# Patient Record
Sex: Female | Born: 1975 | Race: Black or African American | Hispanic: No | Marital: Married | Smoking: Current every day smoker
Health system: Southern US, Community
[De-identification: ages and names within clinical notes are randomized; demographics above are authoritative.]

## PROBLEM LIST (undated history)

## (undated) DIAGNOSIS — R5383 Other fatigue: Secondary | ICD-10-CM

## (undated) DIAGNOSIS — D509 Iron deficiency anemia, unspecified: Secondary | ICD-10-CM

## (undated) DIAGNOSIS — D4959 Neoplasm of unspecified behavior of other genitourinary organ: Secondary | ICD-10-CM

## (undated) DIAGNOSIS — F329 Major depressive disorder, single episode, unspecified: Secondary | ICD-10-CM

## (undated) DIAGNOSIS — M1990 Unspecified osteoarthritis, unspecified site: Secondary | ICD-10-CM

## (undated) DIAGNOSIS — E785 Hyperlipidemia, unspecified: Secondary | ICD-10-CM

## (undated) DIAGNOSIS — I639 Cerebral infarction, unspecified: Secondary | ICD-10-CM

## (undated) DIAGNOSIS — K219 Gastro-esophageal reflux disease without esophagitis: Secondary | ICD-10-CM

## (undated) DIAGNOSIS — F419 Anxiety disorder, unspecified: Secondary | ICD-10-CM

## (undated) DIAGNOSIS — J302 Other seasonal allergic rhinitis: Secondary | ICD-10-CM

## (undated) DIAGNOSIS — I10 Essential (primary) hypertension: Secondary | ICD-10-CM

## (undated) DIAGNOSIS — F32A Depression, unspecified: Secondary | ICD-10-CM

## (undated) DIAGNOSIS — R918 Other nonspecific abnormal finding of lung field: Secondary | ICD-10-CM

## (undated) DIAGNOSIS — N920 Excessive and frequent menstruation with regular cycle: Secondary | ICD-10-CM

## (undated) DIAGNOSIS — F319 Bipolar disorder, unspecified: Secondary | ICD-10-CM

## (undated) HISTORY — DX: Hyperlipidemia, unspecified: E78.5

## (undated) HISTORY — DX: Iron deficiency anemia, unspecified: D50.9

## (undated) HISTORY — DX: Other fatigue: R53.83

## (undated) HISTORY — PX: OTHER SURGICAL HISTORY: SHX169

## (undated) HISTORY — DX: Excessive and frequent menstruation with regular cycle: N92.0

## (undated) HISTORY — DX: Essential (primary) hypertension: I10

## (undated) HISTORY — DX: Anxiety disorder, unspecified: F41.9

## (undated) HISTORY — DX: Depression, unspecified: F32.A

## (undated) HISTORY — DX: Bipolar disorder, unspecified: F31.9

## (undated) HISTORY — PX: TUBAL LIGATION: SHX77

## (undated) HISTORY — DX: Major depressive disorder, single episode, unspecified: F32.9

## (undated) HISTORY — DX: Other nonspecific abnormal finding of lung field: R91.8

---

## 2000-01-02 DIAGNOSIS — T819XXA Unspecified complication of procedure, initial encounter: Secondary | ICD-10-CM

## 2000-01-02 HISTORY — DX: Unspecified complication of procedure, initial encounter: T81.9XXA

## 2005-06-13 ENCOUNTER — Emergency Department: Payer: Self-pay | Admitting: Emergency Medicine

## 2005-08-02 ENCOUNTER — Emergency Department: Payer: Self-pay | Admitting: Emergency Medicine

## 2006-04-23 ENCOUNTER — Inpatient Hospital Stay: Payer: Self-pay

## 2006-09-23 ENCOUNTER — Emergency Department: Payer: Self-pay | Admitting: Emergency Medicine

## 2007-01-03 ENCOUNTER — Ambulatory Visit (HOSPITAL_COMMUNITY): Admission: RE | Admit: 2007-01-03 | Discharge: 2007-01-03 | Payer: Self-pay | Admitting: Surgery

## 2007-01-08 ENCOUNTER — Ambulatory Visit (HOSPITAL_COMMUNITY): Admission: RE | Admit: 2007-01-08 | Discharge: 2007-01-08 | Payer: Self-pay | Admitting: Surgery

## 2007-01-13 ENCOUNTER — Encounter: Admission: RE | Admit: 2007-01-13 | Discharge: 2007-01-13 | Payer: Self-pay | Admitting: Surgery

## 2007-03-27 ENCOUNTER — Emergency Department: Payer: Self-pay | Admitting: Emergency Medicine

## 2007-09-12 ENCOUNTER — Encounter: Admission: RE | Admit: 2007-09-12 | Discharge: 2007-11-11 | Payer: Self-pay | Admitting: Surgery

## 2007-09-29 ENCOUNTER — Ambulatory Visit (HOSPITAL_COMMUNITY): Admission: RE | Admit: 2007-09-29 | Discharge: 2007-09-30 | Payer: Self-pay | Admitting: Surgery

## 2010-05-16 NOTE — Op Note (Signed)
NAMEMarland Kitchen  Virginia Webb, Virginia Webb NO.:  [id number]   MEDICAL RECORD NO.:  [id number]          PATIENT TYPE:  AMB   LOCATION:  DAY                          FACILITY:  [facility]   PHYSICIAN:  Thornton Park. Daphine Deutscher, MD  DATE OF BIRTH:  February 25, 1975   DATE OF PROCEDURE:  09/29/2007  DATE OF DISCHARGE:                               OPERATIVE REPORT   PREOPERATIVE DIAGNOSIS:  Morbid obesity, body mass index of 56.   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   PROCEDURE:  Laparoscopic adjustable gastric banding with Allergan APL  system.   SURGEON:  Thornton Park. Daphine Deutscher, MD   ASSISTANT:  Alfonse Ras, MD   ANESTHESIA:  General endotracheal.   DESCRIPTION OF PROCEDURE:  This 35 year old African American lady was  taken to room 1 on Monday, September 29, 2007, given general anesthesia.  The abdomen was prepped with Techni-Care and draped sterilely.  Access  to the abdomen was gained through the left upper quadrant using a 0  degree OptiVu without difficulty.  Once insufflation was obtained,  standard trocar placements were made.  Most obvious upon entering was  the fact that she had severe Curtis-Fitz-Hugh bands holding the left  lobe of the liver and the left lateral segment and right lobe all stuck  up to the anterior diaphragm.  I subsequently was able to get in the  [facility] retractor and used the standard port placements including a 15  in the right upper position.  I dissected the spot on the left side for  exit of the band passer and went along, identified the right crus and  using a standard pars flaccida technique, inserted the band passer and  was able to bring an APL band around and engage it in the buckle.  The  sizing catheter was passed from above without difficulty.  The band was  snapped around that and there was there was plenty of room.  He was then  held down while I plicated with three sutures using tie knots and  Surgidac.  An anti-obstruction stitch was placed along the lesser  curvature side and I did get a little hematoma in the stomach on the  left side of that.  Tie knots were used with placement of all the  sutures.  The tubing was then brought out through the lower port on the  right, connected to a port and was sutured to the fascia with four  sutures of 2-0 Prolene.  Wounds were then closed in layers of 4-0 Vicryl  with Benzoin and Steri-Strips.  The patient tolerated the procedure  well, was taken to recovery room in satisfactory addition.      Thornton Park Daphine Deutscher, MD  Electronically Signed     MBM/MEDQ  D:  09/29/2007  T:  09/30/2007  Job:  [id number]   cc:   Tamika J. Lazarus Salines, M.D.  Fax: [phone]

## 2010-10-02 LAB — DIFFERENTIAL
Basophils Absolute: 0
Basophils Relative: 0
Basophils Relative: 0
Eosinophils Absolute: 0.1
Monocytes Relative: 6
Neutro Abs: 2.9
Neutrophils Relative %: 76

## 2010-10-02 LAB — CBC
MCHC: 31.6
MCHC: 31.8
MCV: 69.5 — ABNORMAL LOW
MCV: 70.8 — ABNORMAL LOW
Platelets: 278
Platelets: 307
RBC: 5.33 — ABNORMAL HIGH
RDW: 16.6 — ABNORMAL HIGH
WBC: 4.8

## 2010-10-02 LAB — COMPREHENSIVE METABOLIC PANEL
ALT: 23
AST: 16
Alkaline Phosphatase: 52
CO2: 30
Calcium: 9.1
Glucose, Bld: 91
Potassium: 4.3
Sodium: 139
Total Protein: 6.9

## 2010-10-02 LAB — PROTIME-INR: Prothrombin Time: 13.7

## 2011-01-04 ENCOUNTER — Emergency Department: Payer: Self-pay | Admitting: Unknown Physician Specialty

## 2011-01-04 LAB — URINALYSIS, COMPLETE
Bacteria: NONE SEEN
Glucose,UR: NEGATIVE mg/dL (ref 0–75)
Ph: 6 (ref 4.5–8.0)
Specific Gravity: 1.017 (ref 1.003–1.030)
Squamous Epithelial: 6
WBC UR: 5 /HPF (ref 0–5)

## 2011-01-04 LAB — CBC WITH DIFFERENTIAL/PLATELET
Basophil %: 0 %
Eosinophil #: 0.1 10*3/uL (ref 0.0–0.7)
Eosinophil %: 0.5 %
HCT: 30.1 % — ABNORMAL LOW (ref 35.0–47.0)
HGB: 9.1 g/dL — ABNORMAL LOW (ref 12.0–16.0)
MCH: 19.4 pg — ABNORMAL LOW (ref 26.0–34.0)
MCHC: 30.1 g/dL — ABNORMAL LOW (ref 32.0–36.0)
Monocyte #: 0.6 10*3/uL (ref 0.0–0.7)
Neutrophil #: 9.6 10*3/uL — ABNORMAL HIGH (ref 1.4–6.5)
Neutrophil %: 84.3 %
Platelet: 340 10*3/uL (ref 150–440)
RBC: 4.66 10*6/uL (ref 3.80–5.20)

## 2011-01-04 LAB — COMPREHENSIVE METABOLIC PANEL
Alkaline Phosphatase: 42 U/L — ABNORMAL LOW (ref 50–136)
Anion Gap: 9 (ref 7–16)
BUN: 8 mg/dL (ref 7–18)
Bilirubin,Total: 0.6 mg/dL (ref 0.2–1.0)
Chloride: 103 mmol/L (ref 98–107)
EGFR (African American): >60
Osmolality: 281 (ref 275–301)
Potassium: 3.3 mmol/L — ABNORMAL LOW (ref 3.5–5.1)
SGOT(AST): 20 U/L (ref 15–37)
Total Protein: 7.9 g/dL (ref 6.4–8.2)

## 2011-01-04 LAB — LIPASE, BLOOD: Lipase: 72 U/L — ABNORMAL LOW (ref 73–393)

## 2011-01-05 LAB — MAGNESIUM: Magnesium: 1.7 mg/dL — ABNORMAL LOW

## 2011-01-05 LAB — TROPONIN I: Troponin-I: <0.02 ng/mL

## 2011-03-19 ENCOUNTER — Telehealth (INDEPENDENT_AMBULATORY_CARE_PROVIDER_SITE_OTHER): Payer: Self-pay

## 2011-03-19 NOTE — Telephone Encounter (Signed)
Called patient and left message to call back to reschedule her appt from 03/29/11 due to Mardelle Matte being out of town.

## 2011-03-29 ENCOUNTER — Encounter (INDEPENDENT_AMBULATORY_CARE_PROVIDER_SITE_OTHER): Payer: Self-pay

## 2011-04-19 ENCOUNTER — Encounter (INDEPENDENT_AMBULATORY_CARE_PROVIDER_SITE_OTHER): Payer: Self-pay

## 2011-04-19 ENCOUNTER — Ambulatory Visit (INDEPENDENT_AMBULATORY_CARE_PROVIDER_SITE_OTHER): Payer: Federal, State, Local not specified - PPO | Admitting: Physician Assistant

## 2011-04-19 VITALS — BP 138/85 | Ht 68.0 in | Wt 244.2 lb

## 2011-04-19 DIAGNOSIS — Z4651 Encounter for fitting and adjustment of gastric lap band: Secondary | ICD-10-CM

## 2011-04-19 NOTE — Patient Instructions (Signed)
Take clear liquids tonight. Thin protein shakes are ok to start tomorrow morning. Slowly advance your diet thereafter. Call us if you have persistent vomiting or regurgitation, night cough or reflux symptoms. Return as scheduled or sooner if you notice no changes in hunger/portion sizes.  

## 2011-04-19 NOTE — Progress Notes (Signed)
  HISTORY: Virginia Webb is a 36 y.o.female who received an AP-Large lap-band in September 2009 by Dr. Daphine Deutscher. She comes in having last been seen 14 months ago and she has no new complaints. Her hunger is under good control but she's having increased portion sizes. She has no persistent vomiting or reflux.  VITAL SIGNS: Filed Vitals:   04/19/11 1115  BP: 138/85    PHYSICAL EXAM: Physical exam reveals a very well-appearing 36 y.o.female in no apparent distress Neurologic: Awake, alert, oriented Psych: Bright affect, conversant Respiratory: Breathing even and unlabored. No stridor or wheezing Abdomen: Soft, nontender, nondistended to palpation. Incisions well-healed. No incisional hernias. Port easily palpated. Extremities: Atraumatic, good range of motion.  ASSESMENT: 36 y.o.  female  s/p AP-Large lap-band.   PLAN: The patient's port was accessed with a 20G Huber needle without difficulty. Clear fluid was aspirated and 0.5 mL saline was added to the port. The patient was able to swallow water without difficulty following the procedure and was instructed to take clear liquids for the next 24-48 hours and advance slowly as tolerated.

## 2011-05-08 ENCOUNTER — Ambulatory Visit (INDEPENDENT_AMBULATORY_CARE_PROVIDER_SITE_OTHER): Payer: Federal, State, Local not specified - PPO | Admitting: Family Medicine

## 2011-05-08 ENCOUNTER — Encounter: Payer: Self-pay | Admitting: Family Medicine

## 2011-05-08 ENCOUNTER — Other Ambulatory Visit: Payer: Self-pay | Admitting: Family Medicine

## 2011-05-08 VITALS — BP 116/78 | HR 68 | Temp 98.2°F | Ht 67.75 in | Wt 235.0 lb

## 2011-05-08 DIAGNOSIS — D649 Anemia, unspecified: Secondary | ICD-10-CM | POA: Insufficient documentation

## 2011-05-08 DIAGNOSIS — F341 Dysthymic disorder: Secondary | ICD-10-CM

## 2011-05-08 DIAGNOSIS — F32A Depression, unspecified: Secondary | ICD-10-CM

## 2011-05-08 DIAGNOSIS — F329 Major depressive disorder, single episode, unspecified: Secondary | ICD-10-CM

## 2011-05-08 DIAGNOSIS — F419 Anxiety disorder, unspecified: Secondary | ICD-10-CM

## 2011-05-08 DIAGNOSIS — R5383 Other fatigue: Secondary | ICD-10-CM

## 2011-05-08 DIAGNOSIS — R5381 Other malaise: Secondary | ICD-10-CM

## 2011-05-08 DIAGNOSIS — D509 Iron deficiency anemia, unspecified: Secondary | ICD-10-CM

## 2011-05-08 LAB — COMPREHENSIVE METABOLIC PANEL
AST: 12 U/L (ref 0–37)
Albumin: 3.9 g/dL (ref 3.5–5.2)
BUN: 13 mg/dL (ref 6–23)
Calcium: 9.4 mg/dL (ref 8.4–10.5)
Chloride: 101 mEq/L (ref 96–112)
Potassium: 3.4 mEq/L — ABNORMAL LOW (ref 3.5–5.1)
Sodium: 141 mEq/L (ref 135–145)
Total Protein: 8.1 g/dL (ref 6.0–8.3)

## 2011-05-08 LAB — CBC WITH DIFFERENTIAL/PLATELET
Basophils Relative: 0.6 % (ref 0.0–3.0)
Eosinophils Absolute: 0.2 10*3/uL (ref 0.0–0.7)
Eosinophils Relative: 4.7 % (ref 0.0–5.0)
Lymphocytes Relative: 23.8 % (ref 12.0–46.0)
Neutrophils Relative %: 64.6 % (ref 43.0–77.0)
Platelets: 369 10*3/uL (ref 150.0–400.0)
RBC: 4.6 Mil/uL (ref 3.87–5.11)
WBC: 4.5 10*3/uL (ref 4.5–10.5)

## 2011-05-08 MED ORDER — FLUOXETINE HCL 20 MG/5ML PO SOLN: 20.0000 mg | Freq: Every day | ORAL | Status: DC

## 2011-05-08 NOTE — Patient Instructions (Signed)
Nice to meet you. Please schedule a physical and pap smear at your convenience. We are are starting Prozac 20 mg daily- please call me in 3-4 weeks with an update of your symptoms. We will call you with your lab results.

## 2011-05-08 NOTE — Progress Notes (Signed)
Subjective:    Patient ID: Virginia Webb, female    DOB: 07-27-1975, 36 y.o.   MRN: [id number]  HPI  36 yo here to establish care.    Anxiety/depression- reports that it was much worse prior to her lap band procedure a few years ago.  Was once diagnosed with bipolar disorder but says she was never given any antipsychotics.  Denies any manic episodes to her knowledge.  Was previously taking Klonipin.  She is on disability for some chronic pain and neuropathy since she had a complication from an epidural 10 years ago.  At times feels more anxious, "edgy" and tearful. No SI or HI.  Anemia- per pt, h/o profound anemia.  Cannot tolerate iron s/p lab band. Has felt more fatigued lately.    Patient Active Problem List  Diagnoses  . Anemia  . Anxiety and depression   Past Medical History  Diagnosis Date  . Hyperlipidemia   . Hypertension   . Anemia   . Lung nodules   . Anxiety and depression    Past Surgical History  Procedure Date  . Cesarean section 01/31/1995  . Cesarean section 10/22/2000  . Tubal ligation    History  Substance Use Topics  . Smoking status: Current Everyday Smoker  . Smokeless tobacco: Not on file  . Alcohol Use: Yes     social   No family history on file. No Known Allergies Current Outpatient Prescriptions on File Prior to Visit  Medication Sig Dispense Refill  . FLUoxetine (PROZAC) 20 MG/5ML solution Take 5 mLs (20 mg total) by mouth daily.  120 mL  3      Review of Systems    See HPI Patient reports no  vision/ hearing changes,anorexia, weight change, fever ,adenopathy, persistant / recurrent hoarseness, swallowing issues, chest pain, edema,persistant / recurrent cough, hemoptysis, dyspnea(rest, exertional, paroxysmal nocturnal), gastrointestinal  bleeding (melena, rectal bleeding), abdominal pain, excessive heart burn, GU symptoms(dysuria, hematuria, pyuria, voiding/incontinence  Issues) syncope, focal weakness, severe memory loss,  concerning skin lesions,  abnormal bruising/bleeding, major joint swelling, breast masses or abnormal vaginal bleeding.    Objective:   Physical Exam BP 116/78  Pulse 68  Temp(Src) 98.2 F (36.8 C) (Oral)  Ht 5' 7.75" (1.721 m)  Wt 235 lb (106.595 kg)  BMI 36.00 kg/m2  LMP 05/02/2011  General:  Well-developed,well-nourished,in no acute distress; alert,appropriate and cooperative throughout examination Head:  normocephalic and atraumatic.   Eyes:  vision grossly intact, pupils equal, pupils round, and pupils reactive to light.   Ears:  R ear normal and L ear normal.   Nose:  no external deformity.   Mouth:  good dentition.   Neck:  No deformities, masses, or tenderness noted. Lungs:  Normal respiratory effort, chest expands symmetrically. Lungs are clear to auscultation, no crackles or wheezes. Heart:  Normal rate and regular rhythm. S1 and S2 normal without gallop, murmur, click, rub or other extra sounds. Abdomen:  Bowel sounds positive,abdomen soft and non-tender without masses, organomegaly or hernias noted. Msk:  No deformity or scoliosis noted of thoracic or lumbar spine.   Extremities:  No clubbing, cyanosis, edema, or deformity noted with normal full range of motion of all joints.   Neurologic:  alert & oriented X3 and gait normal.   Skin:  Intact without suspicious lesions or rashes Psych:  Cognition and judgment appear intact. Alert and cooperative with normal attention span and concentration. No apparent delusions, illusions, hallucinations     Assessment & Plan:   1. Fatigue  Deteriorated. Most likely due to anemia. Will recheck labs today. CBC with Differential, Comprehensive metabolic panel  2. Anxiety and depression  Deteriorated.  Discussed tx options.  Pt would prefer to defer psychotherapy at this time. Start Prozac 20 mg daily (solution). Follow up by phone in a few weeks. The patient indicates understanding of these issues and agrees with the plan.    3.  Anemia  See above.  Depending on severity, may need to consider IV iron since she has issues with oral iron s/p lab band.

## 2011-05-16 ENCOUNTER — Telehealth: Payer: Self-pay | Admitting: *Deleted

## 2011-05-16 ENCOUNTER — Telehealth: Payer: Self-pay | Admitting: Oncology

## 2011-05-16 NOTE — Telephone Encounter (Signed)
left voice message to inform the patient of the new date and time on 05-24-2011 starting at 10:00am asked to patient to please call me back and confim the appointment

## 2011-05-16 NOTE — Telephone Encounter (Signed)
Referred by Dr. Ruthe Mannan Dx- Anemia

## 2011-05-17 ENCOUNTER — Telehealth: Payer: Self-pay | Admitting: Oncology

## 2011-05-17 NOTE — Telephone Encounter (Signed)
Pt returned call to confirm appt but requested that appt be moved to 5/24. Pt given new d/t for 5/24 @ 10:30 am w/HH.

## 2011-05-23 ENCOUNTER — Encounter: Payer: Self-pay | Admitting: Oncology

## 2011-05-24 ENCOUNTER — Ambulatory Visit: Payer: Federal, State, Local not specified - PPO

## 2011-05-24 ENCOUNTER — Ambulatory Visit: Payer: Federal, State, Local not specified - PPO | Admitting: Oncology

## 2011-05-24 ENCOUNTER — Other Ambulatory Visit: Payer: Federal, State, Local not specified - PPO | Admitting: Lab

## 2011-05-25 ENCOUNTER — Encounter: Payer: Self-pay | Admitting: Oncology

## 2011-05-25 ENCOUNTER — Ambulatory Visit (HOSPITAL_BASED_OUTPATIENT_CLINIC_OR_DEPARTMENT_OTHER): Payer: Federal, State, Local not specified - PPO

## 2011-05-25 ENCOUNTER — Ambulatory Visit: Payer: Federal, State, Local not specified - PPO

## 2011-05-25 ENCOUNTER — Ambulatory Visit: Payer: Federal, State, Local not specified - PPO | Admitting: Oncology

## 2011-05-25 ENCOUNTER — Telehealth: Payer: Self-pay | Admitting: Oncology

## 2011-05-25 ENCOUNTER — Other Ambulatory Visit (HOSPITAL_BASED_OUTPATIENT_CLINIC_OR_DEPARTMENT_OTHER): Payer: Federal, State, Local not specified - PPO

## 2011-05-25 VITALS — BP 108/75 | HR 59 | Temp 98.3°F

## 2011-05-25 VITALS — BP 137/90 | HR 54 | Temp 97.9°F | Ht 67.75 in | Wt 229.0 lb

## 2011-05-25 DIAGNOSIS — D649 Anemia, unspecified: Secondary | ICD-10-CM

## 2011-05-25 LAB — CBC WITH DIFFERENTIAL/PLATELET
BASO%: 0.5 % (ref 0.0–2.0)
EOS%: 2.1 % (ref 0.0–7.0)
Eosinophils Absolute: 0.1 10*3/uL (ref 0.0–0.5)
LYMPH%: 24.5 % (ref 14.0–49.7)
MCH: 18.5 pg — ABNORMAL LOW (ref 25.1–34.0)
MCHC: 30.7 g/dL — ABNORMAL LOW (ref 31.5–36.0)
MCV: 60.2 fL — ABNORMAL LOW (ref 79.5–101.0)
MONO%: 4.7 % (ref 0.0–14.0)
NEUT#: 3.2 10*3/uL (ref 1.5–6.5)
Platelets: 371 10*3/uL (ref 145–400)
RBC: 4.86 10*6/uL (ref 3.70–5.45)
RDW: 19.5 % — ABNORMAL HIGH (ref 11.2–14.5)
nRBC: 0 % (ref 0–0)

## 2011-05-25 LAB — CHCC SMEAR

## 2011-05-25 LAB — MORPHOLOGY

## 2011-05-25 MED ORDER — SODIUM CHLORIDE 0.9 % IV SOLN
1020.0000 mg | Freq: Once | INTRAVENOUS | Status: AC
  Administered 2011-05-25: 1020 mg via INTRAVENOUS
  Filled 2011-05-25: qty 34

## 2011-05-25 NOTE — Patient Instructions (Signed)
Ferumoxytol injection What is this medicine? FERUMOXYTOL is an iron complex. Iron is used to make healthy red blood cells, which carry oxygen and nutrients throughout the body. This medicine is used to treat iron deficiency anemia in people with chronic kidney disease. This medicine may be used for other purposes; ask your health care provider or pharmacist if you have questions. What should I tell my health care provider before I take this medicine? They need to know if you have any of these conditions: -anemia not caused by low iron levels -high levels of iron in the blood -magnetic resonance imaging (MRI) test scheduled -an unusual or allergic reaction to iron, other medicines, foods, dyes, or preservatives -pregnant or trying to get pregnant -breast-feeding How should I use this medicine? This medicine is for infusion into a vein. It is given by a health care professional in a hospital or clinic setting. Talk to your pediatrician regarding the use of this medicine in children. Special care may be needed. Overdosage: If you think you've taken too much of this medicine contact a poison control center or emergency room at once. Overdosage: If you think you have taken too much of this medicine contact a poison control center or emergency room at once. NOTE: This medicine is only for you. Do not share this medicine with others. What if I miss a dose? It is important not to miss your dose. Call your doctor or health care professional if you are unable to keep an appointment. What may interact with this medicine? This medicine may interact with the following medications: -other iron products This list may not describe all possible interactions. Give your health care provider a list of all the medicines, herbs, non-prescription drugs, or dietary supplements you use. Also tell them if you smoke, drink alcohol, or use illegal drugs. Some items may interact with your medicine. What should I watch  for while using this medicine? Visit your doctor or healthcare professional regularly. Tell your doctor or healthcare professional if your symptoms do not start to get better or if they get worse. You may need blood work done while you are taking this medicine. You may need to follow a special diet. Talk to your doctor. Foods that contain iron include: whole grains/cereals, dried fruits, beans, or peas, leafy green vegetables, and organ meats (liver, kidney). What side effects may I notice from receiving this medicine? Side effects that you should report to your doctor or health care professional as soon as possible: -allergic reactions like skin rash, itching or hives, swelling of the face, lips, or tongue -breathing problems -changes in blood pressure -feeling faint or lightheaded, falls -fever or chills -flushing, sweating, or hot feelings -swelling of the ankles or feet Side effects that usually do not require medical attention (Report these to your doctor or health care professional if they continue or are bothersome.): -diarrhea -headache -nausea, vomiting -stomach pain This list may not describe all possible side effects. Call your doctor for medical advice about side effects. You may report side effects to FDA at 1-800-FDA-1088. Where should I keep my medicine? This drug is given in a hospital or clinic and will not be stored at home. NOTE: This sheet is a summary. It may not cover all possible information. If you have questions about this medicine, talk to your doctor, pharmacist, or health care provider.  2012, Elsevier/Gold Standard. (09/10/2007 9:48:25 PM) 

## 2011-05-25 NOTE — Progress Notes (Signed)
Please see consult note; dated same day.   

## 2011-05-25 NOTE — Patient Instructions (Signed)
A.  Anemia:  Most likely iron deficiency.   B.  I recommend IV iron within the next 10 days. (next week is very busy due to the holiday).  Continue oral iron as tolerated (common formulations:  NuIron 150mg  twice daily; or SlowFe 325mg  twice daily; taken with Vitamin C to increase absorption).  C.  Follow up:   - Lab only appointment in about 2 and 4 month.s - Follow up visit in about 6 months.

## 2011-05-25 NOTE — Consult Note (Signed)
[facility]  Telephone:([phone] Fax:([phone]) [phone]     INITIAL HEMATOLOGY CONSULTATION    Referral MD:  Dr. Bryn Gulling. Virginia Webb, M.D.   Reason for Referral:  Microcytic anemia.     HPI: [name] is a 36 year-old woman with history of menorrhagia.  She had history of iron-deficiency anemia in the past since she was in high school.  She had taken oral iron in the past without significant improvement.  Therefore, she has not been adherent.  The oldest available CBC dated 09/29/2007 where WBC 4.8; Hgb 11.8; Plt 307.  She recently switched PCP service to Dr. Dayton Webb.  On 05/08/2011, her WBC was 4.5, Hgb 8.7; MCV 61; Plt 369.  Thus, she was kindly referred to the [facility] for evaluation.  [name] presented to the clinic for the first time today with her husband.  She reported that her menstrual cycle is regular.  Each time, it lasts for about 3 days.  The first 2 days are heavy when she needs to change every 1-2 hours while awake.  She has craving for ice.  She has not been on oral iron for years not because of intolerance but what she thought was ineffectiveness.  She has mild fatigue from anemia; however, she is still independent of activities of daily living and works as a Building services engineer.  Patient denies headache, visual changes, confusion, drenching night sweats, palpable lymph node swelling, mucositis, odynophagia, dysphagia, nausea vomiting, jaundice, chest pain, palpitation, shortness of breath, dyspnea on exertion, productive cough, gum bleeding, epistaxis, hematemesis, hemoptysis, abdominal pain, abdominal swelling, early satiety, melena, hematochezia, hematuria, skin rash, spontaneous bleeding, joint swelling, joint pain, heat or cold intolerance, bowel bladder incontinence, back pain, focal motor weakness, paresthesia, depression, suicidal or homocidal ideation, feeling hopelessness.   Past Medical History  Diagnosis Date  . Hyperlipidemia   .  Hypertension   . Iron deficiency anemia   . Lung nodules   . Anxiety and depression   . Bipolar affective disorder   . Menorrhagia   :    Past Surgical History  Procedure Date  . Cesarean section 01/31/1995  . Cesarean section 10/22/2000  . Tubal ligation   . Lap band   :   CURRENT MEDS: Current Outpatient Prescriptions  Medication Sig Dispense Refill  . FLUoxetine (PROZAC) 20 MG/5ML solution Take 5 mLs (20 mg total) by mouth daily.  120 mL  3   No current facility-administered medications for this visit.   Facility-Administered Medications Ordered in Other Visits  Medication Dose Route Frequency Provider Last Rate Last Dose  . ferumoxytol (FERAHEME) 1,020 mg in sodium chloride 0.9 % 100 mL IVPB  1,020 mg Intravenous Once Exie Parody, MD   1,020 mg at 05/25/11 1316      No Known Allergies:  Family History  Problem Relation Age of Onset  . Alcohol abuse Father   :  History   Social History  . Marital Status: Married    Spouse Name: Virginia Webb    Number of Children: 2  . Years of Education: Virginia Webb   Occupational History  .      [employer]   Social History Main Topics  . Smoking status: Current Everyday Smoker -- 0.2 packs/day for 10 years  . Smokeless tobacco: Not on file  . Alcohol Use: Yes     social  . Drug Use: No  . Sexually Active: Yes    Birth Control/ Protection: None   Other Topics Concern  .  Not on file   Social History Narrative  . No narrative on file  :  REVIEW OF SYSTEM:  The rest of the 14-point review of sytem was negative.   Exam: ECOG 0-1  General:  well-nourished in no acute distress.  Eyes:  no scleral icterus.  ENT:  There were no oropharyngeal lesions.  Neck was without thyromegaly.  Lymphatics:  Negative cervical, supraclavicular or axillary adenopathy.  Respiratory: lungs were clear bilaterally without wheezing or crackles.  Cardiovascular:  Regular rate and rhythm, S1/S2, without murmur, rub or gallop.  There was no pedal edema.   GI:  abdomen was soft, flat, nontender, nondistended, without organomegaly.  Muscoloskeletal:  no spinal tenderness of palpation of vertebral spine.  Skin exam was without echymosis, petichae.  Neuro exam was nonfocal.  Patient was able to get on and off exam table without assistance.  Gait was normal.  Patient was alerted and oriented.  Attention was good.   Language was appropriate.  Mood was normal without depression.  Speech was not pressured.  Thought content was not tangential.  She declined rectal exam since she had it with her PCP and guaiac was negative.   LABS:  Lab Results  Component Value Date   WBC 4.7 05/25/2011   HGB 9.0* 05/25/2011   HCT 29.3* 05/25/2011   PLT 371 05/25/2011   GLUCOSE 79 05/08/2011   ALT 6 05/08/2011   AST 12 05/08/2011   NA 141 05/08/2011   K 3.4* 05/08/2011   CL 101 05/08/2011   CREATININE 0.9 05/08/2011   BUN 13 05/08/2011   CO2 29 05/08/2011   INR 1.0 09/29/2007    Blood smear review:   I personally reviewed the patient's peripheral blood smear today.  There was anisocytosis.  There was no peripheral blast.  There was marked increased in central pallor and occasional target cells. There was no polychromasia or nucleated RBC. There was no schistocytosis, spherocytosis, rouleaux formation, tear drop cell.  There was no giant platelets or platelet clumps.     ASSESSMENT AND PLAN:    A.  Microcytic anemia:  Most likely iron deficiency given history of ice pica, menorrhagia, increased MCV and RDW.  She had normal Hgb in the past and no family history of hemoglobinopathy making sickle cell or thalassemia unlikely.   CMET drawn from 05/08/2011 by PCP showed normal Cr and LFT.  Thus, unlikely that she has hemolysis.   I sent for iron panel today to confirm iron deficiency.  Her hgb is >7 and there is not active bleedigg; thus, pRBC transfusion is not indicated.   B.  I recommend IV iron in the form of Feraheme.  Oral iron replacement would take weeks to correct her symptomatic  anemia.  I still recommend her to start oral iron as tolerated (common formulations:  NuIron 150mg  twice daily; or SlowFe 325mg  twice daily; taken with Vitamin C to increase absorption).  C.  Follow up:   - Lab only appointment in about 2 and 4 months.  - Follow up visit in about 6 months.     Thank you for this referral.   The length of time of the face-to-face encounter was 30 minutes. More than 50% of time was spent counseling and coordination of care.

## 2011-05-25 NOTE — Telephone Encounter (Signed)
appts made and printed for pt aom °

## 2011-05-29 ENCOUNTER — Encounter: Payer: Self-pay | Admitting: Oncology

## 2011-05-29 LAB — IRON AND TIBC
%SAT: 6 % — ABNORMAL LOW (ref 20–55)
Iron: 23 ug/dL — ABNORMAL LOW (ref 42–145)
TIBC: 417 ug/dL (ref 250–470)
UIBC: 394 ug/dL (ref 125–400)

## 2011-05-29 LAB — FERRITIN: Ferritin: 7 ng/mL — ABNORMAL LOW (ref 10–291)

## 2011-05-29 NOTE — Progress Notes (Signed)
New patient, patient has insurance patient was not in need of financial assistance at this time, gave patient my information. 

## 2011-07-23 ENCOUNTER — Telehealth: Payer: Self-pay | Admitting: Oncology

## 2011-07-23 NOTE — Telephone Encounter (Signed)
Patient called because she received a bill. Patient stated that she has medicaid along with BCBS because she is on disability. I added the medicaid and asked patient if she could bring the card so that we could get a copy she stated that she would bring it on 07/24/11. I also advised patient to call our billing office to inform them that she does have medicaid so that they can go back and file it.

## 2011-07-25 ENCOUNTER — Other Ambulatory Visit: Payer: Federal, State, Local not specified - PPO | Admitting: Lab

## 2011-07-26 ENCOUNTER — Telehealth: Payer: Self-pay | Admitting: Oncology

## 2011-07-26 NOTE — Telephone Encounter (Signed)
Pt lmonvm to cx 7/24 lb appt due to ins issues. Returned pt's call to see if she wanted to r/s or needed assistance. Per pt she does have ins but it cost her too much out of pocket every time she has to come. Per pt she received a 200.00 bill the last time she was here which is in the process of being cleared up and resubmitted to both her ins's but she does not want to come for another appt until this 200.00 bill is fully taken care of. Per pt she also has other appts such as for her lab band and each time she has a follow up it's costing her 150.00. Per pt because of the expense she does not think she will be able to continue to keep coming for lb every 2mos. Pt says she has been feeling ok and if she starts to feel bad she will call us. I confirmed next lb appt for 9/25 and per pt she will know better by then whether she can come for the appt or not. lmonvm for desk nurse re above.

## 2011-09-26 ENCOUNTER — Telehealth: Payer: Self-pay | Admitting: *Deleted

## 2011-09-26 ENCOUNTER — Other Ambulatory Visit (HOSPITAL_BASED_OUTPATIENT_CLINIC_OR_DEPARTMENT_OTHER): Payer: Federal, State, Local not specified - PPO | Admitting: Lab

## 2011-09-26 DIAGNOSIS — D649 Anemia, unspecified: Secondary | ICD-10-CM

## 2011-09-26 LAB — CBC WITH DIFFERENTIAL/PLATELET
BASO%: 0.8 % (ref 0.0–2.0)
HCT: 34.7 % — ABNORMAL LOW (ref 34.8–46.6)
LYMPH%: 24.7 % (ref 14.0–49.7)
MCHC: 32.2 g/dL (ref 31.5–36.0)
MCV: 69.9 fL — ABNORMAL LOW (ref 79.5–101.0)
MONO#: 0.3 10*3/uL (ref 0.1–0.9)
MONO%: 7.3 % (ref 0.0–14.0)
NEUT%: 64 % (ref 38.4–76.8)
Platelets: 284 10*3/uL (ref 145–400)
RBC: 4.97 10*6/uL (ref 3.70–5.45)
WBC: 4.4 10*3/uL (ref 3.9–10.3)

## 2011-09-26 NOTE — Telephone Encounter (Signed)
Message copied by Wende Mott on Wed Sep 26, 2011  4:07 PM ------      Message from: HA, Raliegh Ip T      Created: Wed Sep 26, 2011  3:00 PM       Please call pt.  Her Hgb has significantly improved.  Continue oral iron if tolerated.  Thanks.

## 2011-09-26 NOTE — Telephone Encounter (Signed)
Called pt w/ results of CBC and informed Hgb improved significantly and to continue oral iron per Dr. Gaylyn Rong.  Pt verbalized understanding and says she has been feeling increase fatigue over past few days and was wondering if her anemia had worsened.  Informed anemia improved and unlikely to be cause of her fatigue.  Instructed to call her PCP If fatigue worsens or continues.  Keep appt here in November as scheduled.  Pt verbalized understanding.

## 2011-11-21 ENCOUNTER — Other Ambulatory Visit (HOSPITAL_BASED_OUTPATIENT_CLINIC_OR_DEPARTMENT_OTHER): Payer: Federal, State, Local not specified - PPO | Admitting: Lab

## 2011-11-21 ENCOUNTER — Telehealth: Payer: Self-pay | Admitting: Oncology

## 2011-11-21 ENCOUNTER — Ambulatory Visit (HOSPITAL_BASED_OUTPATIENT_CLINIC_OR_DEPARTMENT_OTHER): Payer: Medicaid Other | Admitting: Oncology

## 2011-11-21 VITALS — BP 147/96 | HR 61 | Temp 99.2°F | Resp 20 | Ht 66.0 in | Wt 233.0 lb

## 2011-11-21 DIAGNOSIS — D649 Anemia, unspecified: Secondary | ICD-10-CM

## 2011-11-21 DIAGNOSIS — D509 Iron deficiency anemia, unspecified: Secondary | ICD-10-CM

## 2011-11-21 LAB — CBC WITH DIFFERENTIAL/PLATELET
Eosinophils Absolute: 0.2 10*3/uL (ref 0.0–0.5)
HCT: 33.8 % — ABNORMAL LOW (ref 34.8–46.6)
HGB: 10.8 g/dL — ABNORMAL LOW (ref 11.6–15.9)
LYMPH%: 39.4 % (ref 14.0–49.7)
MONO#: 0.2 10*3/uL (ref 0.1–0.9)
NEUT#: 2 10*3/uL (ref 1.5–6.5)
NEUT%: 50.7 % (ref 38.4–76.8)
Platelets: 306 10*3/uL (ref 145–400)
WBC: 4 10*3/uL (ref 3.9–10.3)
lymph#: 1.6 10*3/uL (ref 0.9–3.3)

## 2011-11-21 LAB — COMPREHENSIVE METABOLIC PANEL (CC13)
ALT: 9 U/L (ref 0–55)
AST: 12 U/L (ref 5–34)
Alkaline Phosphatase: 59 U/L (ref 40–150)
BUN: 11 mg/dL (ref 7.0–26.0)
Calcium: 9.5 mg/dL (ref 8.4–10.4)
Chloride: 106 mEq/L (ref 98–107)
Creatinine: 0.8 mg/dL (ref 0.6–1.1)
Potassium: 4.1 mEq/L (ref 3.5–5.1)

## 2011-11-21 LAB — IRON AND TIBC
%SAT: 7 % — ABNORMAL LOW (ref 20–55)
TIBC: 406 ug/dL (ref 250–470)

## 2011-11-21 LAB — FERRITIN: Ferritin: 6 ng/mL — ABNORMAL LOW (ref 10–291)

## 2011-11-21 NOTE — Telephone Encounter (Signed)
appts made and printed for pt  °

## 2011-11-22 ENCOUNTER — Encounter: Payer: Self-pay | Admitting: Oncology

## 2011-11-22 ENCOUNTER — Other Ambulatory Visit: Payer: Self-pay | Admitting: Oncology

## 2011-11-22 ENCOUNTER — Telehealth: Payer: Self-pay | Admitting: Oncology

## 2011-11-22 ENCOUNTER — Telehealth: Payer: Self-pay | Admitting: Hematology and Oncology

## 2011-11-22 NOTE — Progress Notes (Signed)
Notified patient of ferritin of 6. Recommended that she take oral iron twice a day. I have sent a message to scheduling to schedule an appt for Feraheme infusion within the next 1-2 weeks.

## 2011-11-22 NOTE — Progress Notes (Signed)
[facility]  Telephone:(336) [phone] Fax:(336) [phone]   OFFICE PROGRESS NOTE   Cc:  Ruthe Mannan, MD  DIAGNOSIS: This is a 36 year old female with iron deficiency anemia.  PAST THERAPY: Feraheme 1020 mg on 05/25/2011  CURRENT THERAPY: Ferrous sulfate liquid once a day. She takes vitamin C with her ferrous sulfate.  INTERVAL HISTORY: Virginia Webb 36 y.o. female returns for routine followup. Patient reports that she's been feeling very tired. Denies chest pain, shortness of breath, dyspnea on exertion. She reports that she is having heavy periods her last about 3 days. She is still able to work part-time. She denies any craving for ice at this time. She has been taking oral iron once a day with vitamin C. No other bleeding other than her periods.  Past Medical History  Diagnosis Date  . Hyperlipidemia   . Hypertension   . Iron deficiency anemia   . Lung nodules   . Anxiety and depression   . Bipolar affective disorder   . Menorrhagia     Past Surgical History  Procedure Date  . Cesarean section 01/31/1995  . Cesarean section 10/22/2000  . Tubal ligation   . Lap band     Current Outpatient Prescriptions  Medication Sig Dispense Refill  . ferrous sulfate 220 (44 FE) MG/5ML solution Take 220 mg by mouth daily.      [name] FLUoxetine (PROZAC) 20 MG/5ML solution Take 5 mLs (20 mg total) by mouth daily.  120 mL  3    ALLERGIES:   has no known allergies.  REVIEW OF SYSTEMS:  The rest of the 14-point review of system was negative.   Filed Vitals:   11/21/11 1125  BP: 147/96  Pulse: 61  Temp: 99.2 F (37.3 C)  Resp: 20   Wt Readings from Last 3 Encounters:  11/21/11 233 lb (105.688 kg)  05/25/11 229 lb (103.874 kg)  05/08/11 235 lb (106.595 kg)   ECOG Performance status: 0-1  PHYSICAL EXAMINATION: General:  well-nourished in no acute distress.  Eyes:  no scleral icterus.  ENT:  There were no oropharyngeal lesions.  Neck was without thyromegaly.   Lymphatics:  Negative cervical, supraclavicular or axillary adenopathy.  Respiratory: lungs were clear bilaterally without wheezing or crackles.  Cardiovascular:  Regular rate and rhythm, S1/S2, without murmur, rub or gallop.  There was no pedal edema.  GI:  abdomen was soft, flat, nontender, nondistended, without organomegaly.  Muscoloskeletal:  no spinal tenderness of palpation of vertebral spine.  Skin exam was without echymosis, petichae.  Neuro exam was nonfocal.  Patient was able to get on and off exam table without assistance.  Gait was normal.  Patient was alerted and oriented.  Attention was good.   Language was appropriate.  Mood was normal without depression.  Speech was not pressured.  Thought content was not tangential.    LABORATORY/RADIOLOGY DATA:  Lab Results  Component Value Date   WBC 4.0 11/21/2011   HGB 10.8* 11/21/2011   HCT 33.8* 11/21/2011   PLT 306 11/21/2011   GLUCOSE 79 11/21/2011   ALKPHOS 59 11/21/2011   ALT 9 11/21/2011   AST 12 11/21/2011   NA 141 11/21/2011   K 4.1 11/21/2011   CL 106 11/21/2011   CREATININE 0.8 11/21/2011   BUN 11.0 11/21/2011   CO2 29 11/21/2011   INR 1.0 09/29/2007    ASSESSMENT AND PLAN:   1. Iron deficiency anemia. She is status post IV iron in May 2013. Hemoglobin has improved  to 10.8. MCV remains low. She is on oral iron once a day. She remains fatigued. Pica has improved. She currently has no active bleeding and no blood transfusion is indicated. I have asked her increase her oral iron to twice a day and to continue take this with vitamin C. We will followup on her iron studies which are pending at this time.   2. Followup. She will have a lab only appointment in 2 and 4 months at a followup visit about 6 months.   The length of time of the face-to-face encounter was 15 minutes. More than 50% of time was spent counseling and coordination of care.

## 2011-11-22 NOTE — Telephone Encounter (Signed)
lvm for pt regarding 11.27.13 appt.Virginia KitchenMarland KitchenMarland Webb

## 2011-11-22 NOTE — Telephone Encounter (Signed)
S/w pt re appt for 11/27 @ 8 am. Date per pt.

## 2011-11-27 ENCOUNTER — Ambulatory Visit: Payer: Federal, State, Local not specified - PPO

## 2011-11-28 ENCOUNTER — Ambulatory Visit (HOSPITAL_BASED_OUTPATIENT_CLINIC_OR_DEPARTMENT_OTHER): Payer: Medicaid Other

## 2011-11-28 VITALS — BP 130/86 | Temp 98.9°F | Resp 18

## 2011-11-28 DIAGNOSIS — D509 Iron deficiency anemia, unspecified: Secondary | ICD-10-CM

## 2011-11-28 DIAGNOSIS — D649 Anemia, unspecified: Secondary | ICD-10-CM

## 2011-11-28 MED ORDER — SODIUM CHLORIDE 0.9 % IV SOLN
Freq: Once | INTRAVENOUS | Status: AC
  Administered 2011-11-28: 08:00:00 via INTRAVENOUS

## 2011-11-28 MED ORDER — SODIUM CHLORIDE 0.9 % IV SOLN
1020.0000 mg | Freq: Once | INTRAVENOUS | Status: AC
  Administered 2011-11-28: 1020 mg via INTRAVENOUS
  Filled 2011-11-28: qty 34

## 2011-11-28 NOTE — Patient Instructions (Signed)
Today you received Ferumoxytol injection What is this medicine? FERUMOXYTOL is an iron complex. Iron is used to make healthy red blood cells, which carry oxygen and nutrients throughout the body. This medicine is used to treat iron deficiency anemia in people with chronic kidney disease. This medicine may be used for other purposes; ask your health care provider or pharmacist if you have questions. What should I tell my health care provider before I take this medicine? They need to know if you have any of these conditions: -anemia not caused by low iron levels -high levels of iron in the blood -magnetic resonance imaging (MRI) test scheduled -an unusual or allergic reaction to iron, other medicines, foods, dyes, or preservatives -pregnant or trying to get pregnant -breast-feeding How should I use this medicine? This medicine is for infusion into a vein. It is given by a health care professional in a hospital or clinic setting. Talk to your pediatrician regarding the use of this medicine in children. Special care may be needed. Overdosage: If you think you've taken too much of this medicine contact a poison control center or emergency room at once. Overdosage: If you think you have taken too much of this medicine contact a poison control center or emergency room at once. NOTE: This medicine is only for you. Do not share this medicine with others. What if I miss a dose? It is important not to miss your dose. Call your doctor or health care professional if you are unable to keep an appointment. What may interact with this medicine? This medicine may interact with the following medications: -other iron products This list may not describe all possible interactions. Give your health care provider a list of all the medicines, herbs, non-prescription drugs, or dietary supplements you use. Also tell them if you smoke, drink alcohol, or use illegal drugs. Some items may interact with your  medicine. What should I watch for while using this medicine? Visit your doctor or healthcare professional regularly. Tell your doctor or healthcare professional if your symptoms do not start to get better or if they get worse. You may need blood work done while you are taking this medicine. You may need to follow a special diet. Talk to your doctor. Foods that contain iron include: whole grains/cereals, dried fruits, beans, or peas, leafy green vegetables, and organ meats (liver, kidney). What side effects may I notice from receiving this medicine? Side effects that you should report to your doctor or health care professional as soon as possible: -allergic reactions like skin rash, itching or hives, swelling of the face, lips, or tongue -breathing problems -changes in blood pressure -feeling faint or lightheaded, falls -fever or chills -flushing, sweating, or hot feelings -swelling of the ankles or feet Side effects that usually do not require medical attention (Report these to your doctor or health care professional if they continue or are bothersome.): -diarrhea -headache -nausea, vomiting -stomach pain This list may not describe all possible side effects. Call your doctor for medical advice about side effects. You may report side effects to FDA at 1-800-FDA-1088. Where should I keep my medicine? This drug is given in a hospital or clinic and will not be stored at home. NOTE: This sheet is a summary. It may not cover all possible information. If you have questions about this medicine, talk to your doctor, pharmacist, or health care provider.  2012, Elsevier/Gold Standard. (09/10/2007 9:48:25 PM) 

## 2012-01-23 ENCOUNTER — Other Ambulatory Visit: Payer: Federal, State, Local not specified - PPO

## 2012-03-06 ENCOUNTER — Other Ambulatory Visit (INDEPENDENT_AMBULATORY_CARE_PROVIDER_SITE_OTHER): Payer: Self-pay | Admitting: General Surgery

## 2012-03-06 ENCOUNTER — Encounter (INDEPENDENT_AMBULATORY_CARE_PROVIDER_SITE_OTHER): Payer: Self-pay

## 2012-03-06 ENCOUNTER — Ambulatory Visit (INDEPENDENT_AMBULATORY_CARE_PROVIDER_SITE_OTHER): Payer: Federal, State, Local not specified - PPO | Admitting: Physician Assistant

## 2012-03-06 VITALS — BP 134/82 | HR 72 | Temp 97.0°F | Ht 68.0 in | Wt 252.4 lb

## 2012-03-06 DIAGNOSIS — Z4651 Encounter for fitting and adjustment of gastric lap band: Secondary | ICD-10-CM

## 2012-03-06 DIAGNOSIS — Z9884 Bariatric surgery status: Secondary | ICD-10-CM

## 2012-03-06 NOTE — Patient Instructions (Signed)
Take clear liquids tonight. Thin protein shakes are ok to start tomorrow morning. Slowly advance your diet thereafter. Call us if you have persistent vomiting or regurgitation, night cough or reflux symptoms. Return as scheduled or sooner if you notice no changes in hunger/portion sizes.  

## 2012-03-06 NOTE — Progress Notes (Signed)
  HISTORY: Virginia Webb is a 37 y.o.female who received an AP-Standard lap-band in September 2009 by Dr. Daphine Deutscher. She comes in with 8 lbs weight gain since her last visit 11 months ago due to increasing hunger and larger portion sizes. She has no regurgitation or reflux symptoms. She is walking regularly.  VITAL SIGNS: Filed Vitals:   03/06/12 1156  BP: 134/82  Pulse: 72  Temp: 97 F (36.1 C)    PHYSICAL EXAM: Physical exam reveals a very well-appearing 37 y.o.female in no apparent distress Neurologic: Awake, alert, oriented Psych: Bright affect, conversant Respiratory: Breathing even and unlabored. No stridor or wheezing Abdomen: Soft, nontender, nondistended to palpation. Incisions well-healed. No incisional hernias. Port easily palpated. Extremities: Atraumatic, good range of motion.  ASSESMENT: 37 y.o.  female  s/p AP-Standard lap-band.   PLAN: The patient's port was accessed with a 20G Huber needle without difficulty. Clear fluid was aspirated and 0.5 mL saline was added to the port. The patient was able to swallow water without difficulty following the procedure and was instructed to take clear liquids for the next 24-48 hours and advance slowly as tolerated.

## 2012-03-19 ENCOUNTER — Other Ambulatory Visit: Payer: Federal, State, Local not specified - PPO

## 2012-04-01 ENCOUNTER — Ambulatory Visit: Payer: Federal, State, Local not specified - PPO | Admitting: *Deleted

## 2012-05-21 ENCOUNTER — Other Ambulatory Visit: Payer: Federal, State, Local not specified - PPO | Admitting: Lab

## 2012-05-21 ENCOUNTER — Ambulatory Visit: Payer: Federal, State, Local not specified - PPO | Admitting: Oncology

## 2012-05-21 NOTE — Progress Notes (Signed)
No show.  Reminder letter sent.   

## 2012-06-03 ENCOUNTER — Telehealth: Payer: Self-pay | Admitting: Oncology

## 2012-06-03 ENCOUNTER — Telehealth: Payer: Self-pay | Admitting: *Deleted

## 2012-06-03 NOTE — Telephone Encounter (Signed)
Pt called to report feels like she needs iron infusion and requests labs checked.  Informed pt she missed last office visit and we need to r/s office visit w/ lab.  POF sent to Scheduling and informed her scheduler will call to get appt w/ either Dr. Gaylyn Rong or Belenda Cruise first available at her convenience.  She verbalized understanding.

## 2012-06-05 ENCOUNTER — Encounter (INDEPENDENT_AMBULATORY_CARE_PROVIDER_SITE_OTHER): Payer: Federal, State, Local not specified - PPO

## 2012-06-06 ENCOUNTER — Telehealth: Payer: Self-pay | Admitting: Oncology

## 2012-06-06 ENCOUNTER — Ambulatory Visit (HOSPITAL_BASED_OUTPATIENT_CLINIC_OR_DEPARTMENT_OTHER): Payer: Medicaid Other | Admitting: Oncology

## 2012-06-06 ENCOUNTER — Encounter: Payer: Self-pay | Admitting: Oncology

## 2012-06-06 ENCOUNTER — Ambulatory Visit: Payer: Federal, State, Local not specified - PPO | Admitting: Lab

## 2012-06-06 ENCOUNTER — Other Ambulatory Visit (HOSPITAL_BASED_OUTPATIENT_CLINIC_OR_DEPARTMENT_OTHER): Payer: Federal, State, Local not specified - PPO | Admitting: Lab

## 2012-06-06 ENCOUNTER — Other Ambulatory Visit: Payer: Federal, State, Local not specified - PPO | Admitting: Lab

## 2012-06-06 VITALS — BP 152/99 | HR 66 | Temp 97.9°F | Resp 18 | Ht 68.0 in | Wt 244.5 lb

## 2012-06-06 DIAGNOSIS — R5383 Other fatigue: Secondary | ICD-10-CM

## 2012-06-06 DIAGNOSIS — D649 Anemia, unspecified: Secondary | ICD-10-CM

## 2012-06-06 DIAGNOSIS — D509 Iron deficiency anemia, unspecified: Secondary | ICD-10-CM

## 2012-06-06 DIAGNOSIS — R5381 Other malaise: Secondary | ICD-10-CM

## 2012-06-06 HISTORY — DX: Iron deficiency anemia, unspecified: D50.9

## 2012-06-06 LAB — COMPREHENSIVE METABOLIC PANEL (CC13)
Albumin: 3.5 g/dL (ref 3.5–5.0)
Alkaline Phosphatase: 60 U/L (ref 40–150)
BUN: 9.5 mg/dL (ref 7.0–26.0)
CO2: 27 mEq/L (ref 22–29)
Calcium: 9.1 mg/dL (ref 8.4–10.4)
Chloride: 104 mEq/L (ref 98–107)
Glucose: 98 mg/dl (ref 70–99)
Potassium: 3.5 mEq/L (ref 3.5–5.1)
Sodium: 140 mEq/L (ref 136–145)
Total Protein: 7.2 g/dL (ref 6.4–8.3)

## 2012-06-06 LAB — CBC WITH DIFFERENTIAL/PLATELET
Basophils Absolute: 0.1 10*3/uL (ref 0.0–0.1)
Eosinophils Absolute: 0.1 10*3/uL (ref 0.0–0.5)
HGB: 11.7 g/dL (ref 11.6–15.9)
MCV: 69 fL — ABNORMAL LOW (ref 79.5–101.0)
MONO#: 0.2 10*3/uL (ref 0.1–0.9)
MONO%: 5.4 % (ref 0.0–14.0)
NEUT#: 2.8 10*3/uL (ref 1.5–6.5)
RBC: 5.2 10*6/uL (ref 3.70–5.45)
RDW: 16.5 % — ABNORMAL HIGH (ref 11.2–14.5)
WBC: 4.6 10*3/uL (ref 3.9–10.3)

## 2012-06-06 LAB — IRON AND TIBC
Iron: 78 ug/dL (ref 42–145)
TIBC: 331 ug/dL (ref 250–470)
UIBC: 253 ug/dL (ref 125–400)

## 2012-06-06 NOTE — Progress Notes (Signed)
[facility]  Telephone:(336) [phone] Fax:(336) [phone]   OFFICE PROGRESS NOTE   Cc:  ALDRIDGE,BARBARA, MD  DIAGNOSIS: This is a 37 year old female with iron deficiency anemia.  PAST THERAPY: Feraheme 1020 mg on 05/25/2011  CURRENT THERAPY: Ferrous sulfate liquid along with Vitamin C.   INTERVAL HISTORY: Virginia Webb 37 y.o. female returns with her friend for routine follow up.  She has been taking her liquid ferrous sulfate without problem.  She denied nausea/vomiting, constipation.  She denied menometrorrhagia. Her menstrual cycle is regular, lasting for 3 days without heavy bleeding.  She denied hematemesis, hemoptysis, hematochezia, melena. She no longer has ice pica.  Despite improvement in her hemoglobin, she still has fatigue. She denied relation of her fatigue with her menstrual cycle.  Patient denies fever, anorexia, weight loss, headache, visual changes, confusion, drenching night sweats, palpable lymph node swelling, mucositis, odynophagia, dysphagia, nausea vomiting, jaundice, chest pain, palpitation, shortness of breath, dyspnea on exertion, productive cough, gum bleeding, epistaxis, hematemesis, hemoptysis, abdominal pain, abdominal swelling, early satiety, melena, hematochezia, hematuria, skin rash, spontaneous bleeding, joint swelling, joint pain, heat or cold intolerance, bowel bladder incontinence, back pain, focal motor weakness, paresthesia.    Past Medical History  Diagnosis Date  . Hyperlipidemia   . Hypertension   . Iron deficiency anemia   . Lung nodules   . Anxiety and depression   . Bipolar affective disorder   . Menorrhagia   . Iron deficiency anemia, unspecified 06/06/2012  . Fatigue     Past Surgical History  Procedure Laterality Date  . Cesarean section  01/31/1995  . Cesarean section  10/22/2000  . Tubal ligation    . Lap band      Current Outpatient Prescriptions  Medication Sig Dispense Refill  . ferrous sulfate 220 (44  FE) MG/5ML solution Take 220 mg by mouth daily.      [name] FLUoxetine (PROZAC) 20 MG capsule Take 20 mg by mouth daily.       No current facility-administered medications for this visit.    ALLERGIES:  has No Known Allergies.  REVIEW OF SYSTEMS:  The rest of the 14-point review of system was negative.   Filed Vitals:   06/06/12 1028  BP: 152/99  Pulse: 66  Temp: 97.9 F (36.6 C)  Resp: 18   Wt Readings from Last 3 Encounters:  06/06/12 244 lb 8 oz (110.904 kg)  03/06/12 252 lb 6.4 oz (114.488 kg)  11/21/11 233 lb (105.688 kg)   ECOG Performance status: 0-1  PHYSICAL EXAMINATION: General:  well-nourished woman, in no acute distress.  Eyes:  no scleral icterus.  ENT:  There were no oropharyngeal lesions.  Neck was without thyromegaly.  Lymphatics:  Negative cervical, supraclavicular or axillary adenopathy.  Respiratory: lungs were clear bilaterally without wheezing or crackles.  Cardiovascular:  Regular rate and rhythm, S1/S2, without murmur, rub or gallop.  There was no pedal edema.  GI:  abdomen was soft, flat, nontender, nondistended, without organomegaly.  Muscoloskeletal:  no spinal tenderness of palpation of vertebral spine.  Skin exam was without echymosis, petichae.  Neuro exam was nonfocal.  Patient was able to get on and off exam table without assistance.  Gait was normal.  Patient was alert and oriented.  Attention was good.   Language was appropriate.  Mood was normal without depression.  Speech was not pressured.  Thought content was not tangential.    LABORATORY/RADIOLOGY DATA:  Lab Results  Component Value Date   WBC 4.6 06/06/2012  HGB 11.7 06/06/2012   HCT 35.9 06/06/2012   PLT 283 06/06/2012   GLUCOSE 79 11/21/2011   ALKPHOS 59 11/21/2011   ALT 9 11/21/2011   AST 12 11/21/2011   NA 141 11/21/2011   K 4.1 11/21/2011   CL 106 11/21/2011   CREATININE 0.8 11/21/2011   BUN 11.0 11/21/2011   CO2 29 11/21/2011   INR 1.0 09/29/2007    ASSESSMENT AND PLAN:   1. Iron  deficiency anemia. Her Hgb has been improving on oral iron.  She has been requiring IV iron sporadically.  Her Ferrin is pending today to see if she needs IV iron again.  I advised her to continue oral iron.  In the future, if despite IV iron, and she continues to have severe anemia or iron deficiency, we may consider referral to GI.    2. Fatigue:  This is not due to her anemia since it is getting better.  I sent for TSH to rule out hypothyroidism.  If this is normal, then she will need to follow up with her PCP to discuss other potential causes (differentials:  depresison, fibromyalgia).   3.  Followup. She will have a lab only appointment in 4 and 8 months and return visit in about 1 year.    I informed Virginia Webb that I am leaving the practice.  [facility] will [facility] for her to see another provider when she returns.   The length of time of the face-to-face encounter was 15 minutes. More than 50% of time was spent counseling and coordination of care.

## 2012-06-06 NOTE — Telephone Encounter (Signed)
sent pt back to the lab....gv and printed appt sched and avs for pt.

## 2012-06-19 ENCOUNTER — Ambulatory Visit (INDEPENDENT_AMBULATORY_CARE_PROVIDER_SITE_OTHER): Payer: Federal, State, Local not specified - PPO | Admitting: Physician Assistant

## 2012-06-19 ENCOUNTER — Encounter (INDEPENDENT_AMBULATORY_CARE_PROVIDER_SITE_OTHER): Payer: Self-pay

## 2012-06-19 ENCOUNTER — Telehealth (INDEPENDENT_AMBULATORY_CARE_PROVIDER_SITE_OTHER): Payer: Self-pay | Admitting: *Deleted

## 2012-06-19 VITALS — BP 130/82 | HR 76 | Temp 98.3°F | Resp 18 | Ht 68.0 in | Wt 236.4 lb

## 2012-06-19 DIAGNOSIS — Z4651 Encounter for fitting and adjustment of gastric lap band: Secondary | ICD-10-CM

## 2012-06-19 NOTE — Progress Notes (Signed)
  HISTORY: Virginia Webb is a 37 y.o.female who received an AP-Standard lap-band in September 2009 by Dr. Daphine Deutscher. She comes in today with liquid dysphagia in the mornings since her last fill in March. She has no troubles at night but she does not have much energy during the day as she's unable to get down a protein shake first thing. She wants a little fluid removed so she tolerates liquids in the am.  VITAL SIGNS: Filed Vitals:   06/19/12 0952  BP: 130/82  Pulse: 76  Temp: 98.3 F (36.8 C)  Resp: 18    PHYSICAL EXAM: Physical exam reveals a very well-appearing 37 y.o.female in no apparent distress Neurologic: Awake, alert, oriented Psych: Bright affect, conversant Respiratory: Breathing even and unlabored. No stridor or wheezing Abdomen: Soft, nontender, nondistended to palpation. Incisions well-healed. No incisional hernias. Port easily palpated. Extremities: Atraumatic, good range of motion.  ASSESMENT: 37 y.o.  female  s/p AP-Standard lap-band.   PLAN: The patient's port was accessed with a 20G Huber needle without difficulty. Clear fluid was aspirated and 0.25 mL saline was removed from the port. The patient was advised to concentrate on healthy food choices and to avoid slider foods high in fats and carbohydrates.

## 2012-06-19 NOTE — Patient Instructions (Signed)
Return in one year. Focus on good food choices as well as physical activity. Return sooner if you have an increase in hunger, portion sizes or weight. Return also for difficulty swallowing, night cough, reflux.   

## 2012-06-19 NOTE — Telephone Encounter (Signed)
Patient called this morning to state she believes her lap band is too tight.  Spoke to Fouke LPN who stated to have patient come on into office to see Assaria PA.  Patient is agreeable with POC and on her way to the office at this time.

## 2012-09-04 ENCOUNTER — Ambulatory Visit (INDEPENDENT_AMBULATORY_CARE_PROVIDER_SITE_OTHER): Payer: Federal, State, Local not specified - PPO | Admitting: Physician Assistant

## 2012-09-04 ENCOUNTER — Encounter (INDEPENDENT_AMBULATORY_CARE_PROVIDER_SITE_OTHER): Payer: Self-pay

## 2012-09-04 VITALS — BP 120/77 | HR 69 | Temp 97.6°F | Resp 14 | Ht 68.0 in | Wt 245.8 lb

## 2012-09-04 DIAGNOSIS — Z4651 Encounter for fitting and adjustment of gastric lap band: Secondary | ICD-10-CM

## 2012-09-04 NOTE — Patient Instructions (Signed)

## 2012-09-04 NOTE — Progress Notes (Signed)
  HISTORY: Virginia Webb is a 37 y.o.female who received an AP-Large lap-band in September 2009 by Dr. Daphine Deutscher. She comes in with 9 lbs weight gain since her last visit in June when 0.25 mL was removed due to AM reflux. She has no further symptoms and would like a fill as she's having increasing hunger and larger than desired portion sizes.  VITAL SIGNS: Filed Vitals:   09/04/12 1007  BP: 120/77  Pulse: 69  Temp: 97.6 F (36.4 C)  Resp: 14    PHYSICAL EXAM: Physical exam reveals a very well-appearing 37 y.o.female in no apparent distress Neurologic: Awake, alert, oriented Psych: Bright affect, conversant Respiratory: Breathing even and unlabored. No stridor or wheezing Abdomen: Soft, nontender, nondistended to palpation. Incisions well-healed. No incisional hernias. Port easily palpated. Extremities: Atraumatic, good range of motion.  ASSESMENT: 37 y.o.  female  s/p AP-Large lap-band.   PLAN: The patient's port was accessed with a 20G Huber needle without difficulty. Clear fluid was aspirated and 0.25 mL saline was added to the port. The patient was able to swallow water without difficulty following the procedure and was instructed to take clear liquids for the next 24-48 hours and advance slowly as tolerated.

## 2012-10-07 ENCOUNTER — Other Ambulatory Visit: Payer: Self-pay | Admitting: Family Medicine

## 2012-10-07 ENCOUNTER — Other Ambulatory Visit: Payer: Self-pay

## 2012-10-07 DIAGNOSIS — Z136 Encounter for screening for cardiovascular disorders: Secondary | ICD-10-CM

## 2012-10-07 DIAGNOSIS — Z0000 Encounter for general adult medical examination without abnormal findings: Secondary | ICD-10-CM

## 2012-10-10 ENCOUNTER — Other Ambulatory Visit: Payer: Federal, State, Local not specified - PPO

## 2012-10-14 ENCOUNTER — Encounter: Payer: Self-pay | Admitting: Family Medicine

## 2012-10-14 DIAGNOSIS — Z0289 Encounter for other administrative examinations: Secondary | ICD-10-CM

## 2012-11-06 ENCOUNTER — Encounter (INDEPENDENT_AMBULATORY_CARE_PROVIDER_SITE_OTHER): Payer: Federal, State, Local not specified - PPO

## 2012-11-06 ENCOUNTER — Other Ambulatory Visit: Payer: Self-pay

## 2012-11-14 ENCOUNTER — Encounter (INDEPENDENT_AMBULATORY_CARE_PROVIDER_SITE_OTHER): Payer: Self-pay | Admitting: Physician Assistant

## 2013-02-03 ENCOUNTER — Telehealth: Payer: Self-pay | Admitting: Hematology and Oncology

## 2013-02-03 NOTE — Telephone Encounter (Signed)
called pt and gave her appt on 6/11 r/s from 6/12 due to MD's PAL

## 2013-02-12 ENCOUNTER — Other Ambulatory Visit: Payer: Self-pay | Admitting: Hematology and Oncology

## 2013-02-12 DIAGNOSIS — D509 Iron deficiency anemia, unspecified: Secondary | ICD-10-CM

## 2013-02-13 ENCOUNTER — Other Ambulatory Visit (HOSPITAL_BASED_OUTPATIENT_CLINIC_OR_DEPARTMENT_OTHER): Payer: Federal, State, Local not specified - PPO

## 2013-02-13 DIAGNOSIS — D509 Iron deficiency anemia, unspecified: Secondary | ICD-10-CM

## 2013-02-13 LAB — CBC & DIFF AND RETIC
BASO%: 1 % (ref 0.0–2.0)
BASOS ABS: 0 10*3/uL (ref 0.0–0.1)
EOS ABS: 0.1 10*3/uL (ref 0.0–0.5)
EOS%: 2.6 % (ref 0.0–7.0)
HCT: 34 % — ABNORMAL LOW (ref 34.8–46.6)
HEMOGLOBIN: 11 g/dL — AB (ref 11.6–15.9)
IMMATURE RETIC FRACT: 10 % (ref 1.60–10.00)
LYMPH#: 1.4 10*3/uL (ref 0.9–3.3)
LYMPH%: 34.1 % (ref 14.0–49.7)
MCH: 21.7 pg — ABNORMAL LOW (ref 25.1–34.0)
MCHC: 32.4 g/dL (ref 31.5–36.0)
MCV: 67.2 fL — ABNORMAL LOW (ref 79.5–101.0)
MONO#: 0.3 10*3/uL (ref 0.1–0.9)
MONO%: 7.9 % (ref 0.0–14.0)
NEUT%: 54.4 % (ref 38.4–76.8)
NEUTROS ABS: 2.3 10*3/uL (ref 1.5–6.5)
Platelets: 290 10*3/uL (ref 145–400)
RBC: 5.06 10*6/uL (ref 3.70–5.45)
RDW: 15.4 % — AB (ref 11.2–14.5)
RETIC %: 1.47 % (ref 0.70–2.10)
RETIC CT ABS: 74.38 10*3/uL (ref 33.70–90.70)
WBC: 4.2 10*3/uL (ref 3.9–10.3)

## 2013-02-13 LAB — FERRITIN CHCC: Ferritin: 16 ng/ml (ref 9–269)

## 2013-02-17 LAB — HEMOGLOBINOPATHY EVALUATION
HEMOGLOBIN OTHER: 0 %
HGB A2 QUANT: 5.2 % — AB (ref 2.2–3.2)
HGB A: 93.6 % — AB (ref 96.8–97.8)
Hgb F Quant: 1.2 % (ref 0.0–2.0)
Hgb S Quant: 0 %

## 2013-02-18 ENCOUNTER — Telehealth: Payer: Self-pay | Admitting: *Deleted

## 2013-02-18 ENCOUNTER — Other Ambulatory Visit: Payer: Self-pay | Admitting: Hematology and Oncology

## 2013-02-18 DIAGNOSIS — D509 Iron deficiency anemia, unspecified: Secondary | ICD-10-CM

## 2013-02-18 NOTE — Telephone Encounter (Signed)
Pt states taking Geritol liquid twice daily and label says it has 18 mg of Iron.  States has to have liquid iron due to her lap-band.  Asks if Dr. Alvy Bimler can prescribe another type of liquid w/ more iron or if she needs IV iron again?  States she has been feeling more tired than usual lately.

## 2013-02-18 NOTE — Telephone Encounter (Signed)
Message copied by Cathlean Cower on Wed Feb 18, 2013  8:26 AM ------      Message from: [facility], [place]: Wed Feb 18, 2013  7:37 AM      Regarding: low iron       Please remind patient to take oral iron daily. If she is not taking any, just take 1 tab at night time. If she is already taking one tab, tell her to increase to BID until her next visit ------

## 2013-02-18 NOTE — Telephone Encounter (Signed)
Per staff message and POF I have scheduled appts.  JMW  

## 2013-02-18 NOTE — Telephone Encounter (Signed)
Informed pt Dr. Alvy Bimler wants pt to have IV iron on Friday this week and order has been sent to our Schedulers.  Keep appt in June as scheduled.  She verbalized understanding.

## 2013-02-19 ENCOUNTER — Telehealth: Payer: Self-pay | Admitting: *Deleted

## 2013-02-19 ENCOUNTER — Telehealth: Payer: Self-pay | Admitting: Hematology and Oncology

## 2013-02-19 NOTE — Telephone Encounter (Signed)
, °

## 2013-02-19 NOTE — Telephone Encounter (Signed)
Per staff message and POF I have scheduled appts.  JMW  

## 2013-02-20 ENCOUNTER — Ambulatory Visit: Payer: Federal, State, Local not specified - PPO

## 2013-02-23 ENCOUNTER — Ambulatory Visit (HOSPITAL_BASED_OUTPATIENT_CLINIC_OR_DEPARTMENT_OTHER): Payer: Federal, State, Local not specified - PPO

## 2013-02-23 VITALS — BP 123/88 | HR 57 | Temp 98.5°F | Resp 16

## 2013-02-23 DIAGNOSIS — D649 Anemia, unspecified: Secondary | ICD-10-CM

## 2013-02-23 DIAGNOSIS — D509 Iron deficiency anemia, unspecified: Secondary | ICD-10-CM

## 2013-02-23 MED ORDER — SODIUM CHLORIDE 0.9 % IV SOLN
1020.0000 mg | Freq: Once | INTRAVENOUS | Status: AC
  Administered 2013-02-23: 1020 mg via INTRAVENOUS
  Filled 2013-02-23: qty 34

## 2013-02-23 MED ORDER — SODIUM CHLORIDE 0.9 % IV SOLN
Freq: Once | INTRAVENOUS | Status: AC
  Administered 2013-02-23: 10:00:00 via INTRAVENOUS

## 2013-02-23 NOTE — Patient Instructions (Signed)
Today you received Ferumoxytol injection What is this medicine? FERUMOXYTOL is an iron complex. Iron is used to make healthy red blood cells, which carry oxygen and nutrients throughout the body. This medicine is used to treat iron deficiency anemia in people with chronic kidney disease. This medicine may be used for other purposes; ask your health care provider or pharmacist if you have questions. What should I tell my health care provider before I take this medicine? They need to know if you have any of these conditions: -anemia not caused by low iron levels -high levels of iron in the blood -magnetic resonance imaging (MRI) test scheduled -an unusual or allergic reaction to iron, other medicines, foods, dyes, or preservatives -pregnant or trying to get pregnant -breast-feeding How should I use this medicine? This medicine is for infusion into a vein. It is given by a health care professional in a hospital or clinic setting. Talk to your pediatrician regarding the use of this medicine in children. Special care may be needed. Overdosage: If you think you've taken too much of this medicine contact a poison control center or emergency room at once. Overdosage: If you think you have taken too much of this medicine contact a poison control center or emergency room at once. NOTE: This medicine is only for you. Do not share this medicine with others. What if I miss a dose? It is important not to miss your dose. Call your doctor or health care professional if you are unable to keep an appointment. What may interact with this medicine? This medicine may interact with the following medications: -other iron products This list may not describe all possible interactions. Give your health care provider a list of all the medicines, herbs, non-prescription drugs, or dietary supplements you use. Also tell them if you smoke, drink alcohol, or use illegal drugs. Some items may interact with your  medicine. What should I watch for while using this medicine? Visit your doctor or healthcare professional regularly. Tell your doctor or healthcare professional if your symptoms do not start to get better or if they get worse. You may need blood work done while you are taking this medicine. You may need to follow a special diet. Talk to your doctor. Foods that contain iron include: whole grains/cereals, dried fruits, beans, or peas, leafy green vegetables, and organ meats (liver, kidney). What side effects may I notice from receiving this medicine? Side effects that you should report to your doctor or health care professional as soon as possible: -allergic reactions like skin rash, itching or hives, swelling of the face, lips, or tongue -breathing problems -changes in blood pressure -feeling faint or lightheaded, falls -fever or chills -flushing, sweating, or hot feelings -swelling of the ankles or feet Side effects that usually do not require medical attention (Report these to your doctor or health care professional if they continue or are bothersome.): -diarrhea -headache -nausea, vomiting -stomach pain This list may not describe all possible side effects. Call your doctor for medical advice about side effects. You may report side effects to FDA at 1-800-FDA-1088. Where should I keep my medicine? This drug is given in a hospital or clinic and will not be stored at home. NOTE: This sheet is a summary. It may not cover all possible information. If you have questions about this medicine, talk to your doctor, pharmacist, or health care provider.  2012, Elsevier/Gold Standard. (09/10/2007 9:48:25 PM)

## 2013-06-11 ENCOUNTER — Encounter: Payer: Self-pay | Admitting: Hematology and Oncology

## 2013-06-11 ENCOUNTER — Ambulatory Visit (HOSPITAL_BASED_OUTPATIENT_CLINIC_OR_DEPARTMENT_OTHER): Payer: Federal, State, Local not specified - PPO | Admitting: Hematology and Oncology

## 2013-06-11 ENCOUNTER — Other Ambulatory Visit (HOSPITAL_BASED_OUTPATIENT_CLINIC_OR_DEPARTMENT_OTHER): Payer: Federal, State, Local not specified - PPO

## 2013-06-11 VITALS — BP 131/80 | HR 63 | Temp 98.5°F | Resp 20 | Ht 68.0 in | Wt 254.6 lb

## 2013-06-11 DIAGNOSIS — D509 Iron deficiency anemia, unspecified: Secondary | ICD-10-CM

## 2013-06-11 DIAGNOSIS — D561 Beta thalassemia: Secondary | ICD-10-CM

## 2013-06-11 DIAGNOSIS — Z720 Tobacco use: Secondary | ICD-10-CM | POA: Insufficient documentation

## 2013-06-11 DIAGNOSIS — F17200 Nicotine dependence, unspecified, uncomplicated: Secondary | ICD-10-CM

## 2013-06-11 LAB — CBC & DIFF AND RETIC
BASO%: 0.9 % (ref 0.0–2.0)
BASOS ABS: 0 10*3/uL (ref 0.0–0.1)
EOS ABS: 0.1 10*3/uL (ref 0.0–0.5)
EOS%: 2.5 % (ref 0.0–7.0)
HCT: 35.8 % (ref 34.8–46.6)
HEMOGLOBIN: 11.7 g/dL (ref 11.6–15.9)
Immature Retic Fract: 1.4 % — ABNORMAL LOW (ref 1.60–10.00)
LYMPH%: 24.8 % (ref 14.0–49.7)
MCH: 22.4 pg — ABNORMAL LOW (ref 25.1–34.0)
MCHC: 32.7 g/dL (ref 31.5–36.0)
MCV: 68.6 fL — AB (ref 79.5–101.0)
MONO#: 0.3 10*3/uL (ref 0.1–0.9)
MONO%: 7.4 % (ref 0.0–14.0)
NEUT#: 2.9 10*3/uL (ref 1.5–6.5)
NEUT%: 64.4 % (ref 38.4–76.8)
PLATELETS: 243 10*3/uL (ref 145–400)
RBC: 5.22 10*6/uL (ref 3.70–5.45)
RDW: 15 % — AB (ref 11.2–14.5)
RETIC %: 1.26 % (ref 0.70–2.10)
RETIC CT ABS: 65.77 10*3/uL (ref 33.70–90.70)
WBC: 4.4 10*3/uL (ref 3.9–10.3)
lymph#: 1.1 10*3/uL (ref 0.9–3.3)

## 2013-06-11 LAB — FERRITIN CHCC: Ferritin: 70 ng/ml (ref 9–269)

## 2013-06-11 NOTE — Assessment & Plan Note (Signed)
I spent some time counseling the patient the importance of tobacco cessation. she is currently attempting to quit on her own 

## 2013-06-11 NOTE — Assessment & Plan Note (Signed)
This is related to menorrhagia. Recommend she consider both control pill to control her heavy menstruation. Her iron deficiency has resolved with ferritin greater than 50. However, due to ongoing menorrhagia, I recommend she continue to take 1 oral iron supplement until her menorrhagia gradually resolves. I recommend she gets iron studies rechecked at the end of the year with her primary care provider.

## 2013-06-11 NOTE — Assessment & Plan Note (Signed)
I noted that she has significant microcytosis. I ordered hemoglobinopathy evaluation and confirmed the patient has beta thalassemia. I discussed with her the pathophysiology and genetics behind thalassemia. I would not recommend genetic screening or hemoglobinopathy evaluation in her sons but I do recommend providing them with education and just ordering screening CBC when they become adults.

## 2013-06-11 NOTE — Progress Notes (Signed)
[facility] FOLLOW-UP progress notes  Patient Care Team: Gayland Curry, MD as PCP - General (Family Medicine) Heath Lark, MD as Consulting Physician (Hematology and Oncology)  CHIEF COMPLAINTS/PURPOSE OF VISIT:  Chronic microcytic anemia with iron deficiency  HISTORY OF PRESENTING ILLNESS:  Virginia Webb 38 y.o. female was transferred to my care after her prior physician has left.  I reviewed the patient's records extensive and collaborated the history with the patient. Summary of her history is as follows: This patient was found to have chronic microcytic anemia and was found to have iron deficiency, thought to be related to menorrhagia. In 2013, she received 1 dose of intravenous iron infusion. She has been placed on iron replacement therapy for the last 2 years. She usually takes iron supplements with food due to GI upset. Besides menorrhagia, the patient denies any recent signs or symptoms of bleeding such as spontaneous epistaxis, hematuria or hematochezia.   MEDICAL HISTORY:  Past Medical History  Diagnosis Date  . Hyperlipidemia   . Hypertension   . Iron deficiency anemia   . Lung nodules   . Anxiety and depression   . Bipolar affective disorder   . Menorrhagia   . Iron deficiency anemia, unspecified 06/06/2012  . Fatigue     SURGICAL HISTORY: Past Surgical History  Procedure Laterality Date  . Cesarean section  01/31/1995  . Cesarean section  10/22/2000  . Tubal ligation    . Lap band      SOCIAL HISTORY: History   Social History  . Marital Status: Married    Spouse Name: N/A    Number of Children: 2  . Years of Education: N/A   Occupational History  .      [employer]   Social History Main Topics  . Smoking status: Current Every Day Smoker -- 0.25 packs/day for 10 years  . Smokeless tobacco: Never Used  . Alcohol Use: Yes     Comment: social  . Drug Use: No  . Sexual Activity: Yes    Birth Control/ Protection: None   Other  Topics Concern  . Not on file   Social History Narrative  . No narrative on file    FAMILY HISTORY: Family History  Problem Relation Age of Onset  . Alcohol abuse Father     ALLERGIES:  has No Known Allergies.  MEDICATIONS:  Current Outpatient Prescriptions  Medication Sig Dispense Refill  . ferrous sulfate 325 (65 FE) MG tablet Take 325 mg by mouth daily with breakfast.       No current facility-administered medications for this visit.    REVIEW OF SYSTEMS:   Constitutional: Denies fevers, chills or abnormal night sweats Eyes: Denies blurriness of vision, double vision or watery eyes Ears, nose, mouth, throat, and face: Denies mucositis or sore throat Respiratory: Denies cough, dyspnea or wheezes Cardiovascular: Denies palpitation, chest discomfort or lower extremity swelling Gastrointestinal:  Denies nausea, heartburn or change in bowel habits Skin: Denies abnormal skin rashes Lymphatics: Denies new lymphadenopathy or easy bruising Neurological:Denies numbness, tingling or new weaknesses Behavioral/Psych: Mood is stable, no new changes  All other systems were reviewed with the patient and are negative.  PHYSICAL EXAMINATION: ECOG PERFORMANCE STATUS: 1 - Symptomatic but completely ambulatory  Filed Vitals:   06/11/13 1320  BP: 131/80  Pulse: 63  Temp: 98.5 F (36.9 C)  Resp: 20   Filed Weights   06/11/13 1320  Weight: 254 lb 9.6 oz (115.486 kg)    GENERAL:alert, no distress  and comfortable. She is morbidly obese SKIN: skin color, texture, turgor are normal, no rashes or significant lesions EYES: normal, conjunctiva are pink and non-injected, sclera clear OROPHARYNX:no exudate, normal lips, buccal mucosa, and tongue  NECK: supple, thyroid normal size, non-tender, without nodularity LYMPH:  no palpable lymphadenopathy in the cervical, axillary or inguinal LUNGS: clear to auscultation and percussion with normal breathing effort HEART: regular rate & rhythm and  no murmurs without lower extremity edema ABDOMEN:abdomen soft, non-tender and normal bowel sounds Musculoskeletal:no cyanosis of digits and no clubbing  PSYCH: alert & oriented x 3 with fluent speech NEURO: no focal motor/sensory deficits  LABORATORY DATA:  I have reviewed the data as listed Lab Results  Component Value Date   WBC 4.4 06/11/2013   HGB 11.7 06/11/2013   HCT 35.8 06/11/2013   MCV 68.6* 06/11/2013   PLT 243 06/11/2013   No results found for this basename: NA, K, CL, CO2, GLUCOSE, BUN, CREATININE, CALCIUM, GFRNONAA, GFRAA, PROT, ALBUMIN, AST, ALT, ALKPHOS, BILITOT, BILIDIR, IBILI,  in the last 8760 hours  ASSESSMENT & PLAN:  Iron deficiency anemia, unspecified This is related to menorrhagia. Recommend she consider both control pill to control her heavy menstruation. Her iron deficiency has resolved with ferritin greater than 50. However, due to ongoing menorrhagia, I recommend she continue to take 1 oral iron supplement until her menorrhagia gradually resolves. I recommend she gets iron studies rechecked at the end of the year with her primary care provider.  Thalassemia, beta I noted that she has significant microcytosis. I ordered hemoglobinopathy evaluation and confirmed the patient has beta thalassemia. I discussed with her the pathophysiology and genetics behind thalassemia. I would not recommend genetic screening or hemoglobinopathy evaluation in her sons but I do recommend providing them with education and just ordering screening CBC when they become adults.  Tobacco abuse I spent some time counseling the patient the importance of tobacco cessation. she is currently attempting to quit on her own    All questions were answered. The patient knows to call the clinic with any problems, questions or concerns. I spent 25 minutes counseling the patient face to face. The total time spent in the appointment was 30 minutes and more than 50% was on counseling.      [facility], Neftali Thurow, MD 06/11/2013 8:36 PM

## 2013-06-12 ENCOUNTER — Telehealth: Payer: Self-pay | Admitting: *Deleted

## 2013-06-12 ENCOUNTER — Other Ambulatory Visit: Payer: Federal, State, Local not specified - PPO

## 2013-06-12 ENCOUNTER — Ambulatory Visit: Payer: Federal, State, Local not specified - PPO | Admitting: Hematology and Oncology

## 2013-06-12 NOTE — Telephone Encounter (Signed)
Informed pt of Dr. Gorsuch's message below. She verbalized understanding.  

## 2013-06-12 NOTE — Telephone Encounter (Signed)
Message copied by Cathlean Cower on Fri Jun 12, 2013  3:18 PM ------      Message from: [facility], [place]: Thu Jun 11, 2013  2:21 PM      Regarding: iron test       Pls let her know her ferritin is 70.      I recommend she takes 1 dose iron only daily and recheck with PCP end of the year.      OK to stop iron if ferritin is >100      ----- Message -----         From: [facility]         Sent: 06/11/2013   1:03 PM           To: Heath Lark, MD                   ------

## 2013-06-12 NOTE — Telephone Encounter (Signed)
Left VM for pt to return nurse's call.  

## 2013-10-26 ENCOUNTER — Other Ambulatory Visit: Payer: Self-pay | Admitting: Hematology and Oncology

## 2013-10-26 ENCOUNTER — Telehealth: Payer: Self-pay | Admitting: *Deleted

## 2013-10-26 DIAGNOSIS — D509 Iron deficiency anemia, unspecified: Secondary | ICD-10-CM

## 2013-10-26 NOTE — Telephone Encounter (Signed)
Left VM for pt informing will make lab appt first to check her iron studies.  Expect call from scheduler.  Call nurse back if any questions.

## 2013-10-26 NOTE — Telephone Encounter (Signed)
She needs recheck iron studies first. I will put in order for labs and will call her if ferritin <50

## 2013-10-26 NOTE — Telephone Encounter (Signed)
Pt left VM states feeling like she needs IV iron.  She would like to come back here for the IV iron.  States her PCP does not understand her condition.

## 2013-10-27 ENCOUNTER — Telehealth: Payer: Self-pay | Admitting: Hematology and Oncology

## 2013-10-27 NOTE — Telephone Encounter (Signed)
s.w. pt and advised on NOV lab....pt ok adn aware

## 2013-11-02 ENCOUNTER — Other Ambulatory Visit (HOSPITAL_BASED_OUTPATIENT_CLINIC_OR_DEPARTMENT_OTHER): Payer: Federal, State, Local not specified - PPO

## 2013-11-02 ENCOUNTER — Other Ambulatory Visit: Payer: Self-pay | Admitting: Hematology and Oncology

## 2013-11-02 ENCOUNTER — Telehealth: Payer: Self-pay | Admitting: *Deleted

## 2013-11-02 DIAGNOSIS — D509 Iron deficiency anemia, unspecified: Secondary | ICD-10-CM

## 2013-11-02 LAB — CBC & DIFF AND RETIC
BASO%: 0.4 % (ref 0.0–2.0)
BASOS ABS: 0 10*3/uL (ref 0.0–0.1)
EOS ABS: 0.2 10*3/uL (ref 0.0–0.5)
EOS%: 3.3 % (ref 0.0–7.0)
HCT: 35.7 % (ref 34.8–46.6)
HGB: 11.5 g/dL — ABNORMAL LOW (ref 11.6–15.9)
IMMATURE RETIC FRACT: 7.1 % (ref 1.60–10.00)
LYMPH#: 1.4 10*3/uL (ref 0.9–3.3)
LYMPH%: 28.8 % (ref 14.0–49.7)
MCH: 21.6 pg — ABNORMAL LOW (ref 25.1–34.0)
MCHC: 32.2 g/dL (ref 31.5–36.0)
MCV: 67.1 fL — ABNORMAL LOW (ref 79.5–101.0)
MONO#: 0.3 10*3/uL (ref 0.1–0.9)
MONO%: 6 % (ref 0.0–14.0)
NEUT%: 61.5 % (ref 38.4–76.8)
NEUTROS ABS: 3 10*3/uL (ref 1.5–6.5)
Platelets: 310 10*3/uL (ref 145–400)
RBC: 5.32 10*6/uL (ref 3.70–5.45)
RDW: 14.6 % — AB (ref 11.2–14.5)
RETIC %: 1.36 % (ref 0.70–2.10)
RETIC CT ABS: 72.35 10*3/uL (ref 33.70–90.70)
WBC: 4.8 10*3/uL (ref 3.9–10.3)

## 2013-11-02 LAB — IRON AND TIBC CHCC
%SAT: 16 % — ABNORMAL LOW (ref 21–57)
IRON: 49 ug/dL (ref 41–142)
TIBC: 306 ug/dL (ref 236–444)
UIBC: 257 ug/dL (ref 120–384)

## 2013-11-02 LAB — FERRITIN CHCC: Ferritin: 38 ng/ml (ref 9–269)

## 2013-11-02 NOTE — Telephone Encounter (Signed)
Informed pt of lab results and Dr. Alvy Bimler recommends IV iron.  Pt would like to come in next Tuesday 11/10.  Request sent to Scheduler.

## 2013-11-02 NOTE — Telephone Encounter (Signed)
-----   Message from Heath Lark, MD sent at 11/02/2013 11:29 AM EST ----- Regarding: IDA She is iron def again. Does she want repeat IV iron and when? I will write order once you talked to her ----- Message -----    From: [facility]    Sent: 11/02/2013   9:13 AM      To: Heath Lark, MD

## 2013-11-02 NOTE — Telephone Encounter (Signed)
I placed order. Recommend PCP to recheck in 6 months

## 2013-11-03 ENCOUNTER — Telehealth: Payer: Self-pay | Admitting: Hematology and Oncology

## 2013-11-03 ENCOUNTER — Telehealth: Payer: Self-pay | Admitting: *Deleted

## 2013-11-03 NOTE — Telephone Encounter (Signed)
Per staff message and POF I have scheduled appts. Advised scheduler of appts. JMW  

## 2013-11-03 NOTE — Telephone Encounter (Signed)
Confirm appt d/t for 11/10/13.

## 2013-11-10 ENCOUNTER — Ambulatory Visit (HOSPITAL_BASED_OUTPATIENT_CLINIC_OR_DEPARTMENT_OTHER): Payer: Federal, State, Local not specified - PPO

## 2013-11-10 DIAGNOSIS — D509 Iron deficiency anemia, unspecified: Secondary | ICD-10-CM

## 2013-11-10 MED ORDER — FERUMOXYTOL INJECTION 510 MG/17 ML
1020.0000 mg | Freq: Once | INTRAVENOUS | Status: AC
  Administered 2013-11-10: 1020 mg via INTRAVENOUS
  Filled 2013-11-10: qty 34

## 2013-11-10 MED ORDER — SODIUM CHLORIDE 0.9 % IV SOLN
Freq: Once | INTRAVENOUS | Status: AC
  Administered 2013-11-10: 11:00:00 via INTRAVENOUS

## 2013-11-10 NOTE — Patient Instructions (Signed)

## 2013-11-10 NOTE — Progress Notes (Signed)
30 min obs BP 141/ 98.  Pt is asymptomatic.  Pt states she is just ready to go and that is why her BP is high.  She checks her BP at home and states she will check it when she gets home.  Currently not on any BP meds.   Will forward info to Dr Alvy Bimler.

## 2013-11-11 ENCOUNTER — Other Ambulatory Visit: Payer: Self-pay | Admitting: Hematology and Oncology

## 2014-03-04 ENCOUNTER — Other Ambulatory Visit (INDEPENDENT_AMBULATORY_CARE_PROVIDER_SITE_OTHER): Payer: Self-pay | Admitting: *Deleted

## 2014-03-04 DIAGNOSIS — R1084 Generalized abdominal pain: Secondary | ICD-10-CM

## 2014-07-27 ENCOUNTER — Telehealth: Payer: Self-pay | Admitting: Hematology and Oncology

## 2014-07-27 NOTE — Telephone Encounter (Signed)
returned call to sched appt....lvm for pt to call back to sched

## 2014-11-19 ENCOUNTER — Emergency Department
Admission: EM | Admit: 2014-11-19 | Discharge: 2014-11-19 | Disposition: A | Discharge status: Home / Self Care | Payer: Federal, State, Local not specified - PPO | Attending: Emergency Medicine | Admitting: Emergency Medicine

## 2014-11-19 ENCOUNTER — Encounter: Payer: Self-pay | Admitting: *Deleted

## 2014-11-19 DIAGNOSIS — I10 Essential (primary) hypertension: Secondary | ICD-10-CM | POA: Diagnosis not present

## 2014-11-19 DIAGNOSIS — D649 Anemia, unspecified: Secondary | ICD-10-CM | POA: Diagnosis not present

## 2014-11-19 DIAGNOSIS — E860 Dehydration: Secondary | ICD-10-CM | POA: Insufficient documentation

## 2014-11-19 DIAGNOSIS — F17200 Nicotine dependence, unspecified, uncomplicated: Secondary | ICD-10-CM | POA: Insufficient documentation

## 2014-11-19 LAB — URINALYSIS COMPLETE WITH MICROSCOPIC (ARMC ONLY)
BACTERIA UA: NONE SEEN
Bilirubin Urine: NEGATIVE
Glucose, UA: NEGATIVE mg/dL
Leukocytes, UA: NEGATIVE
Nitrite: NEGATIVE
PH: 5 (ref 5.0–8.0)
Protein, ur: 30 mg/dL — AB
Specific Gravity, Urine: 1.03 (ref 1.005–1.030)

## 2014-11-19 LAB — CBC
HEMATOCRIT: 37.1 % (ref 35.0–47.0)
Hemoglobin: 11.9 g/dL — ABNORMAL LOW (ref 12.0–16.0)
MCH: 21.1 pg — AB (ref 26.0–34.0)
MCHC: 31.9 g/dL — AB (ref 32.0–36.0)
MCV: 66 fL — AB (ref 80.0–100.0)
PLATELETS: 311 10*3/uL (ref 150–440)
RBC: 5.63 MIL/uL — ABNORMAL HIGH (ref 3.80–5.20)
RDW: 15.5 % — AB (ref 11.5–14.5)
WBC: 5.2 10*3/uL (ref 3.6–11.0)

## 2014-11-19 LAB — BASIC METABOLIC PANEL
Anion gap: 7 (ref 5–15)
BUN: 14 mg/dL (ref 6–20)
CHLORIDE: 103 mmol/L (ref 101–111)
CO2: 28 mmol/L (ref 22–32)
CREATININE: 1.13 mg/dL — AB (ref 0.44–1.00)
Calcium: 9.3 mg/dL (ref 8.9–10.3)
GFR calc Af Amer: >60 mL/min (ref >60)
GFR calc non Af Amer: 60 mL/min — ABNORMAL LOW (ref >60)
GLUCOSE: 89 mg/dL (ref 65–99)
POTASSIUM: 3.5 mmol/L (ref 3.5–5.1)
SODIUM: 138 mmol/L (ref 135–145)

## 2014-11-19 MED ORDER — PROMETHAZINE HCL 25 MG PO TABS: 25.0000 mg | ORAL_TABLET | Freq: Four times a day (QID) | ORAL | Status: DC | PRN

## 2014-11-19 MED ORDER — SODIUM CHLORIDE 0.9 % IV SOLN
Freq: Once | INTRAVENOUS | Status: AC
  Administered 2014-11-19: 17:00:00 via INTRAVENOUS

## 2014-11-19 NOTE — Discharge Instructions (Signed)
Dehydration, Adult °Dehydration is a condition in which you do not have enough fluid or water in your body. It happens when you take in less fluid than you lose. Vital organs such as the kidneys, brain, and heart cannot function without a proper amount of fluids. Any loss of fluids from the body can cause dehydration.  °Dehydration can range from mild to severe. This condition should be treated right away to help prevent it from becoming severe. °CAUSES  °This condition may be caused by: °· Vomiting. °· Diarrhea. °· Excessive sweating, such as when exercising in hot or humid weather. °· Not drinking enough fluid during strenuous exercise or during an illness. °· Excessive urine output. °· Fever. °· Certain medicines. °RISK FACTORS °This condition is more likely to develop in: °· People who are taking certain medicines that cause the body to lose excess fluid (diuretics).   °· People who have a chronic illness, such as diabetes, that may increase urination. °· Older adults.   °· People who live at high altitudes.   °· People who participate in endurance sports.   °SYMPTOMS  °Mild Dehydration °· Thirst. °· Dry lips. °· Slightly dry mouth. °· Dry, warm skin. °Moderate Dehydration °· Very dry mouth.   °· Muscle cramps.   °· Dark urine and decreased urine production.   °· Decreased tear production.   °· Headache.   °· Light-headedness, especially when you stand up from a sitting position.   °Severe Dehydration °· Changes in skin.   °¨ Cold and clammy skin.   °¨ Skin does not spring back quickly when lightly pinched and released.   °· Changes in body fluids.   °¨ Extreme thirst.   °¨ No tears.   °¨ Not able to sweat when body temperature is high, such as in hot weather.   °¨ Minimal urine production.   °· Changes in vital signs.   °¨ Rapid, weak pulse (more than 100 beats per minute when you are sitting still).   °¨ Rapid breathing.   °¨ Low blood pressure.   °· Other changes.   °¨ Sunken eyes.   °¨ Cold hands and feet.    °¨ Confusion. °¨ Lethargy and difficulty being awakened. °¨ Fainting (syncope).   °¨ Short-term weight loss.   °¨ Unconsciousness. °DIAGNOSIS  °This condition may be diagnosed based on your symptoms. You may also have tests to determine how severe your dehydration is. These tests may include:  °· Urine tests.   °· Blood tests.   °TREATMENT  °Treatment for this condition depends on the severity. Mild or moderate dehydration can often be treated at home. Treatment should be started right away. Do not wait until dehydration becomes severe. Severe dehydration needs to be treated at the hospital. °Treatment for Mild Dehydration °· Drinking plenty of water to replace the fluid you have lost.   °· Replacing minerals in your blood (electrolytes) that you may have lost.   °Treatment for Moderate Dehydration  °· Consuming oral rehydration solution (ORS). °Treatment for Severe Dehydration °· Receiving fluid through an IV tube.   °· Receiving electrolyte solution through a feeding tube that is passed through your nose and into your stomach (nasogastric tube or NG tube). °· Correcting any abnormalities in electrolytes. °HOME CARE INSTRUCTIONS  °· Drink enough fluid to keep your urine clear or pale yellow.   °· Drink water or fluid slowly by taking small sips. You can also try sucking on ice cubes.  °· Have food or beverages that contain electrolytes. Examples include bananas and sports drinks. °· Take over-the-counter and prescription medicines only as told by your health care provider.   °· Prepare ORS according to the manufacturer's instructions. Take sips   of ORS every 5 minutes until your urine returns to normal.  If you have vomiting or diarrhea, continue to try to drink water, ORS, or both.   If you have diarrhea, avoid:   Beverages that contain caffeine.   Fruit juice.   Milk.   Carbonated soft drinks.  Do not take salt tablets. This can lead to the condition of having too much sodium in your body  (hypernatremia).  SEEK MEDICAL CARE IF:  You cannot eat or drink without vomiting.  You have had moderate diarrhea during a period of more than 24 hours.  You have a fever. SEEK IMMEDIATE MEDICAL CARE IF:   You have extreme thirst.  You have severe diarrhea.  You have not urinated in 6-8 hours, or you have urinated only a small amount of very dark urine.  You have shriveled skin.  You are dizzy, confused, or both.   This information is not intended to replace advice given to you by your health care provider. Make sure you discuss any questions you have with your health care provider.   Document Released: 12/18/2004 Document Revised: 09/08/2014 Document Reviewed: 05/05/2014 Elsevier Interactive Patient Education 2016 [employer]. Anemia, Nonspecific Anemia is a condition in which the concentration of red blood cells or hemoglobin in the blood is below normal. Hemoglobin is a substance in red blood cells that carries oxygen to the tissues of the body. Anemia results in not enough oxygen reaching these tissues.  CAUSES  Common causes of anemia include:   Excessive bleeding. Bleeding may be internal or external. This includes excessive bleeding from periods (in women) or from the intestine.   Poor nutrition.   Chronic kidney, thyroid, and liver disease.  Bone marrow disorders that decrease red blood cell production.  Cancer and treatments for cancer.  HIV, AIDS, and their treatments.  Spleen problems that increase red blood cell destruction.  Blood disorders.  Excess destruction of red blood cells due to infection, medicines, and autoimmune disorders. SIGNS AND SYMPTOMS   Minor weakness.   Dizziness.   Headache.  Palpitations.   Shortness of breath, especially with exercise.   Paleness.  Cold sensitivity.  Indigestion.  Nausea.  Difficulty sleeping.  Difficulty concentrating. Symptoms may occur suddenly or they may develop slowly.  DIAGNOSIS   Additional blood tests are often needed. These help your health care provider determine the best treatment. Your health care provider will check your stool for blood and look for other causes of blood loss.  TREATMENT  Treatment varies depending on the cause of the anemia. Treatment can include:   Supplements of iron, vitamin 123456, or folic acid.   Hormone medicines.   A blood transfusion. This may be needed if blood loss is severe.   Hospitalization. This may be needed if there is significant continual blood loss.   Dietary changes.  Spleen removal. HOME CARE INSTRUCTIONS Keep all follow-up appointments. It often takes many weeks to correct anemia, and having your health care provider check on your condition and your response to treatment is very important. SEEK IMMEDIATE MEDICAL CARE IF:   You develop extreme weakness, shortness of breath, or chest pain.   You become dizzy or have trouble concentrating.  You develop heavy vaginal bleeding.   You develop a rash.   You have bloody or black, tarry stools.   You faint.   You vomit up blood.   You vomit repeatedly.   You have abdominal pain.  You have a fever or persistent symptoms  for more than 2-3 days.   You have a fever and your symptoms suddenly get worse.   You are dehydrated.  MAKE SURE YOU:  Understand these instructions.  Will watch your condition.  Will get help right away if you are not doing well or get worse.   This information is not intended to replace advice given to you by your health care provider. Make sure you discuss any questions you have with your health care provider.   Document Released: 01/26/2004 Document Revised: 08/20/2012 Document Reviewed: 06/13/2012 Elsevier Interactive Patient Education [employer].

## 2014-11-19 NOTE — ED Provider Notes (Addendum)
[facility] Emergency Department Provider Note     Time seen: ----------------------------------------- 4:55 PM on 11/19/2014 -----------------------------------------    I have reviewed the triage vital signs and the nursing notes.   HISTORY  Chief Complaint Dehydration    HPI Virginia Webb is a 39 y.o. 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 Patient states she called her doctor who sent to ER states she thinks she is dehydrated from her disease. She does have fatigue, poor appetite and nausea with vomiting. She denies any diarrhea or significant abdominal pain. Patient has aching pain in her arms and legs. Currently takes iron as directed.   Past Medical History  Diagnosis Date  . Hyperlipidemia   . Hypertension   . Iron deficiency anemia   . Lung nodules   . Anxiety and depression   . Bipolar affective disorder ([place])   . Menorrhagia   . Iron deficiency anemia, unspecified 06/06/2012  . Fatigue     Patient Active Problem List   Diagnosis Date Noted  . Thalassemia, beta ([place]) 06/11/2013  . Tobacco abuse 06/11/2013  . Morbid obesity ([place]) 06/19/2012  . Iron deficiency anemia, unspecified 06/06/2012  . Fatigue   . Anemia   . Anxiety and depression     Past Surgical History  Procedure Laterality Date  . Cesarean section  01/31/1995  . Cesarean section  10/22/2000  . Tubal ligation    . Lap band      Allergies Review of patient's allergies indicates no known allergies.  Social History Social History  Substance Use Topics  . Smoking status: Current Every Day Smoker -- 1.00 packs/day for 10 years  . Smokeless tobacco: Never Used  . Alcohol Use: Yes     Comment: social    Review of Systems Constitutional: Negative for fever. Eyes: Negative for visual changes. ENT: Negative for sore throat. Cardiovascular: Negative for chest pain. Respiratory: Negative for shortness of breath. Gastrointestinal: Negative  for abdominal pain, positive for nausea vomiting Genitourinary: Negative for dysuria. Musculoskeletal: Positive for muscle aches in the arms and legs Skin: Negative for rash. Neurological: Negative for headaches, focal weakness or numbness.  10-point ROS otherwise negative.  ____________________________________________   PHYSICAL EXAM:  VITAL SIGNS: ED Triage Vitals  Enc Vitals Group     BP 11/19/14 1629 149/100 mmHg     Pulse Rate 11/19/14 1629 88     Resp 11/19/14 1629 20     Temp 11/19/14 1629 99.6 F (37.6 C)     Temp Source 11/19/14 1629 Oral     SpO2 11/19/14 1629 100 %     Weight 11/19/14 1629 220 lb (99.791 kg)     Height 11/19/14 1629 5\' 7"  (1.702 m)     Head Cir --      Peak Flow --      Pain Score --      Pain Loc --      Pain Edu? --      Excl. in [place]? --     Constitutional: Alert and oriented. Well appearing and in no distress. Eyes: Conjunctivae are normal. PERRL. Normal extraocular movements. ENT   Head: Normocephalic and atraumatic.   Nose: No congestion/rhinnorhea.   Mouth/Throat: Mucous membranes are moist.   Neck: No stridor. Cardiovascular: Normal rate, regular rhythm. Normal and symmetric distal pulses are present in all extremities. No murmurs, rubs, or gallops. Respiratory: Normal respiratory effort without tachypnea nor retractions. Breath sounds are clear and equal bilaterally. No  wheezes/rales/rhonchi. Gastrointestinal: Soft and nontender. No distention. No abdominal bruits.  Musculoskeletal: Nontender with normal range of motion in all extremities. No joint effusions.  No lower extremity tenderness nor edema. Neurologic:  Normal speech and language. No gross focal neurologic deficits are appreciated. Speech is normal. No gait instability. Skin:  Skin is warm, dry and intact. No rash noted. Psychiatric: Mood and affect are normal. Speech and behavior are normal. Patient exhibits appropriate insight and  judgment. ____________________________________________  ED COURSE:  Pertinent labs & imaging results that were available during my care of the patient were reviewed by me and considered in my medical decision making (see chart for details). Patient is in no acute distress, will check basic labs and give fluid bolus. ____________________________________________   EKG: Normal sinus rhythm with normal axis normal intervals, no evidence of hypertrophy or acute infarction. Rate is 71 bpm  LABS (pertinent positives/negatives)  Labs Reviewed  BASIC METABOLIC PANEL - Abnormal; Notable for the following:    Creatinine, Ser 1.13 (*)    GFR calc non Af Amer 60 (*)    All other components within normal limits  CBC - Abnormal; Notable for the following:    RBC 5.63 (*)    Hemoglobin 11.9 (*)    MCV 66.0 (*)    MCH 21.1 (*)    MCHC 31.9 (*)    RDW 15.5 (*)    All other components within normal limits  URINALYSIS COMPLETEWITH MICROSCOPIC ([facility] ONLY) - Abnormal; Notable for the following:    Color, Urine AMBER (*)    APPearance HAZY (*)    Ketones, ur TRACE (*)    Hgb urine dipstick 1+ (*)    Protein, ur 30 (*)    Squamous Epithelial / LPF 6-30 (*)    All other components within normal limits  ____________________________________________  FINAL ASSESSMENT AND PLAN  Anemia, dehydration  Plan: Patient with labs and imaging as dictated above. Labs reveals stable anemia, urine is indeterminate for infection. I will send a urine culture and she will be called an antibiotic if this grows out significant infection. She'll be discharged with antiemetics and encouraged to have close follow-up with her doctor.   Earleen Newport, MD   Earleen Newport, MD 11/19/14 [place], MD 11/19/14 [phone]

## 2014-11-19 NOTE — ED Notes (Signed)
Pt has Cooley's Anemia, pt called MD who sent pt to ER, pt states" I feel like I am dehydrated from my disease", pt complains of fatigue

## 2015-02-07 ENCOUNTER — Emergency Department
Admission: EM | Admit: 2015-02-07 | Discharge: 2015-02-07 | Disposition: A | Discharge status: Home / Self Care | Payer: Federal, State, Local not specified - PPO | Attending: Emergency Medicine | Admitting: Emergency Medicine

## 2015-02-07 DIAGNOSIS — R531 Weakness: Secondary | ICD-10-CM | POA: Insufficient documentation

## 2015-02-07 DIAGNOSIS — I10 Essential (primary) hypertension: Secondary | ICD-10-CM | POA: Insufficient documentation

## 2015-02-07 DIAGNOSIS — R112 Nausea with vomiting, unspecified: Secondary | ICD-10-CM

## 2015-02-07 DIAGNOSIS — Z79899 Other long term (current) drug therapy: Secondary | ICD-10-CM | POA: Diagnosis not present

## 2015-02-07 DIAGNOSIS — F17200 Nicotine dependence, unspecified, uncomplicated: Secondary | ICD-10-CM | POA: Insufficient documentation

## 2015-02-07 DIAGNOSIS — Z3202 Encounter for pregnancy test, result negative: Secondary | ICD-10-CM | POA: Diagnosis not present

## 2015-02-07 DIAGNOSIS — R5383 Other fatigue: Secondary | ICD-10-CM | POA: Diagnosis present

## 2015-02-07 LAB — URINALYSIS COMPLETE WITH MICROSCOPIC (ARMC ONLY)
BILIRUBIN URINE: NEGATIVE
GLUCOSE, UA: NEGATIVE mg/dL
Ketones, ur: NEGATIVE mg/dL
LEUKOCYTES UA: NEGATIVE
NITRITE: NEGATIVE
Protein, ur: NEGATIVE mg/dL
Specific Gravity, Urine: 1.024 (ref 1.005–1.030)
pH: 6 (ref 5.0–8.0)

## 2015-02-07 LAB — CBC
HCT: 32.6 % — ABNORMAL LOW (ref 35.0–47.0)
Hemoglobin: 10.5 g/dL — ABNORMAL LOW (ref 12.0–16.0)
MCH: 21.3 pg — ABNORMAL LOW (ref 26.0–34.0)
MCHC: 32.3 g/dL (ref 32.0–36.0)
MCV: 65.9 fL — ABNORMAL LOW (ref 80.0–100.0)
PLATELETS: 257 10*3/uL (ref 150–440)
RBC: 4.95 MIL/uL (ref 3.80–5.20)
RDW: 16.3 % — AB (ref 11.5–14.5)
WBC: 3.3 10*3/uL — AB (ref 3.6–11.0)

## 2015-02-07 LAB — COMPREHENSIVE METABOLIC PANEL
ALT: 8 U/L — AB (ref 14–54)
AST: 13 U/L — AB (ref 15–41)
Albumin: 3.6 g/dL (ref 3.5–5.0)
Alkaline Phosphatase: 50 U/L (ref 38–126)
Anion gap: 3 — ABNORMAL LOW (ref 5–15)
BILIRUBIN TOTAL: 0.4 mg/dL (ref 0.3–1.2)
BUN: 12 mg/dL (ref 6–20)
CO2: 31 mmol/L (ref 22–32)
CREATININE: 0.76 mg/dL (ref 0.44–1.00)
Calcium: 8.6 mg/dL — ABNORMAL LOW (ref 8.9–10.3)
Chloride: 106 mmol/L (ref 101–111)
Glucose, Bld: 86 mg/dL (ref 65–99)
Potassium: 3.1 mmol/L — ABNORMAL LOW (ref 3.5–5.1)
Sodium: 140 mmol/L (ref 135–145)
TOTAL PROTEIN: 6.8 g/dL (ref 6.5–8.1)

## 2015-02-07 LAB — POCT PREGNANCY, URINE: PREG TEST UR: NEGATIVE

## 2015-02-07 LAB — LIPASE, BLOOD: Lipase: 24 U/L (ref 11–51)

## 2015-02-07 MED ORDER — ONDANSETRON HCL 4 MG/2ML IJ SOLN
4.0000 mg | Freq: Once | INTRAMUSCULAR | Status: AC
  Administered 2015-02-07: 4 mg via INTRAVENOUS

## 2015-02-07 MED ORDER — ONDANSETRON HCL 4 MG/2ML IJ SOLN
4.0000 mg | Freq: Once | INTRAMUSCULAR | Status: AC
  Administered 2015-02-07: 4 mg via INTRAVENOUS
  Filled 2015-02-07: qty 2

## 2015-02-07 MED ORDER — ONDANSETRON HCL 4 MG/2ML IJ SOLN
INTRAMUSCULAR | Status: AC
  Administered 2015-02-07: 4 mg via INTRAVENOUS
  Filled 2015-02-07: qty 2

## 2015-02-07 MED ORDER — POTASSIUM CHLORIDE 20 MEQ PO PACK
PACK | ORAL | Status: AC
  Administered 2015-02-07: 40 meq
  Filled 2015-02-07: qty 2

## 2015-02-07 MED ORDER — ONDANSETRON HCL 4 MG PO TABS: 4.0000 mg | ORAL_TABLET | Freq: Every day | ORAL | Status: DC | PRN

## 2015-02-07 MED ORDER — SODIUM CHLORIDE 0.9 % IV BOLUS (SEPSIS)
1000.0000 mL | Freq: Once | INTRAVENOUS | Status: AC
  Administered 2015-02-07: 1000 mL via INTRAVENOUS

## 2015-02-07 MED ORDER — POTASSIUM CHLORIDE CRYS ER 20 MEQ PO TBCR: 40.0000 meq | EXTENDED_RELEASE_TABLET | Freq: Two times a day (BID) | ORAL | Status: DC

## 2015-02-07 NOTE — Discharge Instructions (Signed)
Weakness Weakness is a lack of strength. It may be felt all over the body (generalized) or in one specific part of the body (focal). Some causes of weakness can be serious. You may need further medical evaluation, especially if you are elderly or you have a history of immunosuppression (such as chemotherapy or HIV), kidney disease, heart disease, or diabetes. CAUSES  Weakness can be caused by many different things, including:  Infection.  Physical exhaustion.  Internal bleeding or other blood loss that results in a lack of red blood cells (anemia).  Dehydration. This cause is more common in elderly people.  Side effects or electrolyte abnormalities from medicines, such as pain medicines or sedatives.  Emotional distress, anxiety, or depression.  Circulation problems, especially severe peripheral arterial disease.  Heart disease, such as rapid atrial fibrillation, bradycardia, or heart failure.  Nervous system disorders, such as Guillain-Barr syndrome, multiple sclerosis, or stroke. DIAGNOSIS  To find the cause of your weakness, your caregiver will take your history and perform a physical exam. Lab tests or X-rays may also be ordered, if needed. TREATMENT  Treatment of weakness depends on the cause of your symptoms and can vary greatly. HOME CARE INSTRUCTIONS   Rest as needed.  Eat a well-balanced diet.  Try to get some exercise every day.  Only take over-the-counter or prescription medicines as directed by your caregiver. SEEK MEDICAL CARE IF:   Your weakness seems to be getting worse or spreads to other parts of your body.  You develop new aches or pains. SEEK IMMEDIATE MEDICAL CARE IF:   You cannot perform your normal daily activities, such as getting dressed and feeding yourself.  You cannot walk up and down stairs, or you feel exhausted when you do so.  You have shortness of breath or chest pain.  You have difficulty moving parts of your body.  You have weakness  in only one area of the body or on only one side of the body.  You have a fever.  You have trouble speaking or swallowing.  You cannot control your bladder or bowel movements.  You have black or bloody vomit or stools. MAKE SURE YOU:  Understand these instructions.  Will watch your condition.  Will get help right away if you are not doing well or get worse.   This information is not intended to replace advice given to you by your health care provider. Make sure you discuss any questions you have with your health care provider.   Document Released: 12/18/2004 Document Revised: 06/19/2011 Document Reviewed: 02/16/2011 Elsevier Interactive Patient Education 2016 Else[employer]  Nausea and Vomiting Nausea means you feel sick to your stomach. Throwing up (vomiting) is a reflex where stomach contents come out of your mouth. HOME CARE   Take medicine as told by your doctor.  Do not force yourself to eat. However, you do need to drink fluids.  If you feel like eating, eat a normal diet as told by your doctor.  Eat rice, wheat, potatoes, bread, lean meats, yogurt, fruits, and vegetables.  Avoid high-fat foods.  Drink enough fluids to keep your pee (urine) clear or pale yellow.  Ask your doctor how to replace body fluid losses (rehydrate). Signs of body fluid loss (dehydration) include:  Feeling very thirsty.  Dry lips and mouth.  Feeling dizzy.  Dark pee.  Peeing less than normal.  Feeling confused.  Fast breathing or heart rate. GET HELP RIGHT AWAY IF:   You have blood in your throw up.  You have black or bloody poop (stool).  You have a bad headache or stiff neck.  You feel confused.  You have bad belly (abdominal) pain.  You have chest pain or trouble breathing.  You do not pee at least once every 8 hours.  You have cold, clammy skin.  You keep throwing up after 24 to 48 hours.  You have a fever. MAKE SURE YOU:   Understand these  instructions.  Will watch your condition.  Will get help right away if you are not doing well or get worse.   This information is not intended to replace advice given to you by your health care provider. Make sure you discuss any questions you have with your health care provider.   Document Released: 06/06/2007 Document Revised: 03/12/2011 Document Reviewed: 05/19/2010 Elsevier Interactive Patient Education [employer].

## 2015-02-07 NOTE — ED Notes (Signed)
Pt states vomited juice and meds just after taking potassium. Repeat dose given per MD instruction.

## 2015-02-07 NOTE — ED Notes (Signed)
Pt reports hx of anemia and states " i have been in the bed for 3 days". Pt also reports HA.

## 2015-02-07 NOTE — ED Provider Notes (Signed)
[facility] Emergency Department Provider Note  ____________________________________________  Time seen: Approximately 850 AM  I have reviewed the triage vital signs and the nursing notes.   HISTORY  Chief Complaint Fatigue    HPI Virginia Webb is a 40 y.o. female with a history of Cooley's anemia who is presenting today with 3 days of weakness. She says that she has had iron infusions as well as blood transfusions off and on since 2002. She says that about a week ago she finished a heavy period. Denies any bleeding vaginally this time or bleeding in her stool. Says that she just feels weak all over. Last infusion or transfusion was 8 months ago. Says that also associated with constant body aches that are chronic and unchanged. Also associated with chronic nausea. States that she has been in bed for 3 days at this point. Says that had a similar event this past November where she got fluids and nausea meds and felt better and was able to be safely discharged home. Has a hematologist as well as a primary care doctor to follow up with.   Past Medical History  Diagnosis Date  . Hyperlipidemia   . Hypertension   . Iron deficiency anemia   . Lung nodules   . Anxiety and depression   . Bipolar affective disorder ([place])   . Menorrhagia   . Iron deficiency anemia, unspecified 06/06/2012  . Fatigue     Patient Active Problem List   Diagnosis Date Noted  . Thalassemia, beta ([place]) 06/11/2013  . Tobacco abuse 06/11/2013  . Morbid obesity ([place]) 06/19/2012  . Iron deficiency anemia, unspecified 06/06/2012  . Fatigue   . Anemia   . Anxiety and depression     Past Surgical History  Procedure Laterality Date  . Cesarean section  01/31/1995  . Cesarean section  10/22/2000  . Tubal ligation    . Lap band      Current Outpatient Rx  Name  Route  Sig  Dispense  Refill  . ferrous sulfate 325 (65 FE) MG tablet   Oral   Take 325 mg by mouth daily with  breakfast.           Allergies Review of patient's allergies indicates no known allergies.  Family History  Problem Relation Age of Onset  . Alcohol abuse Father     Social History Social History  Substance Use Topics  . Smoking status: Current Every Day Smoker -- 1.00 packs/day for 10 years  . Smokeless tobacco: Never Used  . Alcohol Use: Yes     Comment: social    Review of Systems Constitutional: No fever/chills Eyes: No visual changes. ENT: No sore throat. Cardiovascular: Denies chest pain. Respiratory: Denies shortness of breath. Gastrointestinal: No abdominal pain.  No nausea, no vomiting.  No diarrhea.  No constipation. Genitourinary: Negative for dysuria. Musculoskeletal: Negative for back pain. Skin: Negative for rash. Neurological: Negative for headaches, focal weakness or numbness.  10-point ROS otherwise negative.  ____________________________________________   PHYSICAL EXAM:  VITAL SIGNS: ED Triage Vitals  Enc Vitals Group     BP 02/07/15 0805 158/102 mmHg     Pulse Rate 02/07/15 0805 69     Resp 02/07/15 0805 18     Temp 02/07/15 0805 98.4 F (36.9 C)     Temp src --      SpO2 --      Weight 02/07/15 0805 218 lb (98.884 kg)     Height 02/07/15 0805 5\' 8"  (  1.727 m)     Head Cir --      Peak Flow --      Pain Score 02/07/15 0806 2     Pain Loc --      Pain Edu? --      Excl. in [place]? --     Constitutional: Alert and oriented. Well appearing and in no acute distress. Eyes: Conjunctivae are normal. PERRL. EOMI. Head: Atraumatic. Nose: No congestion/rhinnorhea. Mouth/Throat: Mucous membranes are moist.   Neck: No stridor.   Cardiovascular: Normal rate, regular rhythm. Grossly normal heart sounds.  Good peripheral circulation. Respiratory: Normal respiratory effort.  No retractions. Lungs CTAB. Gastrointestinal: Soft and nontender. No distention. No abdominal bruits. No CVA tenderness. Musculoskeletal: No lower extremity tenderness nor  edema.  No joint effusions. Neurologic:  Normal speech and language. No gross focal neurologic deficits are appreciated. No gait instability. Skin:  Skin is warm, dry and intact. No rash noted. Psychiatric: Mood and affect are normal. Speech and behavior are normal.  ____________________________________________   LABS (all labs ordered are listed, but only abnormal results are displayed)  Labs Reviewed  COMPREHENSIVE METABOLIC PANEL - Abnormal; Notable for the following:    Potassium 3.1 (*)    Calcium 8.6 (*)    AST 13 (*)    ALT 8 (*)    Anion gap 3 (*)    All other components within normal limits  CBC - Abnormal; Notable for the following:    WBC 3.3 (*)    Hemoglobin 10.5 (*)    HCT 32.6 (*)    MCV 65.9 (*)    MCH 21.3 (*)    RDW 16.3 (*)    All other components within normal limits  URINALYSIS COMPLETEWITH MICROSCOPIC ([facility] ONLY) - Abnormal; Notable for the following:    Color, Urine YELLOW (*)    APPearance HAZY (*)    Hgb urine dipstick 1+ (*)    Bacteria, UA RARE (*)    Squamous Epithelial / LPF 6-30 (*)    All other components within normal limits  LIPASE, BLOOD  POC URINE PREG, ED  POCT PREGNANCY, URINE   ____________________________________________  EKG   ____________________________________________  RADIOLOGY   ____________________________________________   PROCEDURES   ____________________________________________   INITIAL IMPRESSION / ASSESSMENT AND PLAN / ED COURSE  Pertinent labs & imaging results that were available during my care of the patient were reviewed by me and considered in my medical decision making (see chart for details).  ----------------------------------------- 1:09 PM on 02/07/2015 -----------------------------------------  Patient with one episode of nausea and vomiting but now feeling better and tolerating by mouth after Zofran. Also potassium was repleted. Anemic but appears to be close to her baseline. Likely  ongoing symptoms of her chronic weakness due to her ongoing medical conditions. Will be following up with her primary care doctor. We'll discharge to home. Lab results as well as plan explained to the patient as well as her husband were understanding and willing to comply. ____________________________________________   FINAL CLINICAL IMPRESSION(S) / ED DIAGNOSES  Weakness. Nausea and vomiting.    Orbie Pyo, MD 02/07/15 1310

## 2015-02-17 ENCOUNTER — Telehealth: Payer: Self-pay | Admitting: *Deleted

## 2015-02-17 NOTE — Telephone Encounter (Signed)
Pt called states she feels like her Iron is low, but doesn't feel like anyone doing anything about it.  She feels really tired and feels like "this is killing me" and "this is horrible."  She says she doesn't understand why Virginia Webb let her go.  She has had several trips to ED for fatigue and nausea in the past 6 months which she is convinced is due to low iron.  She is frustrated that the ED did not check her iron levels.  Informed pt Virginia Webb had recommended pt follow w/ PCP to have her Iron level checked once a year.  Pt actually has appt to see her PCP, Dr. Dyanne Webb, this Monday 2/20.  Pt complains that she thinks PCP "doesn't know what to check for" and "no one understands what Virginia Webb wants."   Pt sounded angry.  Allowed pt to vent, gave her time to complain.  Informed pt Virginia Webb available to see her again if her iron is low again.  Since pt is seeing her PCP on Monday,  Nurse will send over her records and call over there to ask them to check Ferritin.  Explained to pt if Ferritin is low then pt can call us and we will make appt to see Virginia Webb again and possible Iron infusion.  Pt agreed to this plan.  I called Dr. Malcolm Webb office and s/w nurse, Butch Penny.  Informed her of above,  Requested Dr. Dyanne Webb check Ferritin Level and please fax [place] results.  Please let us know if Dr. Dyanne Webb feels pt needs to be seen again by Virginia Webb.  I faxed over last office note from June 2015, Recent ED visit and lab results for past 18 months to Dr. Dyanne Webb at fax [phone].

## 2015-02-23 ENCOUNTER — Other Ambulatory Visit: Payer: Self-pay | Admitting: Hematology and Oncology

## 2015-02-23 ENCOUNTER — Telehealth: Payer: Self-pay | Admitting: *Deleted

## 2015-02-23 ENCOUNTER — Other Ambulatory Visit: Payer: Self-pay | Admitting: *Deleted

## 2015-02-23 DIAGNOSIS — D509 Iron deficiency anemia, unspecified: Secondary | ICD-10-CM

## 2015-02-23 NOTE — Telephone Encounter (Signed)
Pt left message stating Ferritin is 37. Does not want to be followed by Dr Dyanne Carrel, feels like they do not know what they are doing.  From previous note, Dr Dyanne Carrel is to fax [place] results.

## 2015-02-23 NOTE — Telephone Encounter (Signed)
Can you ask her if she can come in next week Thurs or Fri (3/2 or 3/3) for appt to see me plus IV iron? We need the labs faxed before then so we can order IV iron She has to be here in the morning Let me know and I will place POF

## 2015-02-24 ENCOUNTER — Telehealth: Payer: Self-pay | Admitting: *Deleted

## 2015-02-24 ENCOUNTER — Telehealth: Payer: Self-pay | Admitting: Hematology and Oncology

## 2015-02-24 NOTE — Telephone Encounter (Signed)
Per Dr. Alvy Bimler "Can you ask her if she can come in next week Thurs or Fri (3/2 or 3/3) for appt to see me plus IV iron? We need the labs faxed before then so we can order IV iron She has to be here in the morning Let me know and I will place POF"  I called pt and left VM informing of Dr. Calton Dach message above. . Asked her to please call back to let us know when she can come.

## 2015-02-24 NOTE — Telephone Encounter (Signed)
S/w pt, gave appt 3/2 @ 12.30.

## 2015-03-03 ENCOUNTER — Ambulatory Visit (HOSPITAL_BASED_OUTPATIENT_CLINIC_OR_DEPARTMENT_OTHER): Payer: Federal, State, Local not specified - PPO

## 2015-03-03 ENCOUNTER — Encounter: Payer: Self-pay | Admitting: Hematology and Oncology

## 2015-03-03 ENCOUNTER — Telehealth: Payer: Self-pay | Admitting: Hematology and Oncology

## 2015-03-03 ENCOUNTER — Other Ambulatory Visit: Payer: Self-pay | Admitting: Hematology and Oncology

## 2015-03-03 ENCOUNTER — Ambulatory Visit (HOSPITAL_BASED_OUTPATIENT_CLINIC_OR_DEPARTMENT_OTHER): Payer: Federal, State, Local not specified - PPO | Admitting: Hematology and Oncology

## 2015-03-03 VITALS — BP 140/85 | HR 63

## 2015-03-03 VITALS — BP 144/98 | HR 53 | Temp 98.0°F | Resp 18 | Wt 210.9 lb

## 2015-03-03 DIAGNOSIS — Z720 Tobacco use: Secondary | ICD-10-CM

## 2015-03-03 DIAGNOSIS — D509 Iron deficiency anemia, unspecified: Secondary | ICD-10-CM

## 2015-03-03 DIAGNOSIS — N920 Excessive and frequent menstruation with regular cycle: Secondary | ICD-10-CM

## 2015-03-03 MED ORDER — SODIUM CHLORIDE 0.9 % IV SOLN
Freq: Once | INTRAVENOUS | Status: AC
  Administered 2015-03-03: 14:00:00 via INTRAVENOUS

## 2015-03-03 MED ORDER — FERUMOXYTOL INJECTION 510 MG/17 ML
510.0000 mg | Freq: Once | INTRAVENOUS | Status: AC
  Administered 2015-03-03: 510 mg via INTRAVENOUS
  Filled 2015-03-03: qty 17

## 2015-03-03 NOTE — Assessment & Plan Note (Signed)
I recommend she has further discussion with her gynecologist about management of menorrhagia.

## 2015-03-03 NOTE — Telephone Encounter (Signed)
Gave and pritned appt sched and avs for pt for March and Sept

## 2015-03-03 NOTE — Assessment & Plan Note (Signed)
The most likely cause of her anemia is due to chronic blood loss/malabsorption syndrome. We discussed some of the risks, benefits, and alternatives of intravenous iron infusions. The patient is symptomatic from anemia and the iron level is critically low. She tolerated oral iron supplement poorly and desires to achieved higher levels of iron faster for adequate hematopoesis. Some of the side-effects to be expected including risks of infusion reactions, phlebitis, headaches, nausea and fatigue.  The patient is willing to proceed. Patient education material was dispensed.  Goal is to keep ferritin level greater than 50  

## 2015-03-03 NOTE — Assessment & Plan Note (Signed)
I spent some time counseling the patient the importance of tobacco cessation. she is currently attempting to quit on her own 

## 2015-03-03 NOTE — Progress Notes (Signed)
[address], MD SUMMARY OF HEMATOLOGIC HISTORY:  Virginia Webb was transferred to my care after her prior physician has left.  I reviewed the patient's records extensive and collaborated the history with the patient. Summary of her history is as follows: This patient was found to have chronic microcytic anemia and was found to have iron deficiency, thought to be related to menorrhagia. She had history of gastric surgery for morbid obesity. From 2014 to present, she received intermittent doses of intravenous iron.  She has been placed on iron replacement therapy for the last 2 years. She usually takes iron supplements with food due to GI upset.  INTERVAL HISTORY: Virginia Webb 40 y.o. female returns for further follow-up  She complained of excessive menorrhagia for 3 days every month. The patient denies any recent signs or symptoms of bleeding such as spontaneous epistaxis, hematuria or hematochezia. She complained of profound fatigue She denies pica. She denies sleep apnea and does not use C Pap machine at night. I have reviewed the past medical history, past surgical history, social history and family history with the patient and they are unchanged from previous note.  ALLERGIES:  has No Known Allergies.  MEDICATIONS:  Current Outpatient Prescriptions  Medication Sig Dispense Refill  . ferrous sulfate 220 (44 Fe) MG/5ML solution Take 325 mg by mouth daily.     [name] FLUoxetine (PROZAC) 20 MG/5ML solution Take 20 mg by mouth daily.    . Multiple Vitamins-Minerals (MULTIVITAMIN ADULT) CHEW Chew by mouth daily.     No current facility-administered medications for this visit.     REVIEW OF SYSTEMS:   Constitutional: Denies fevers, chills or night sweats Eyes: Denies blurriness of vision Ears, nose, mouth, throat, and face: Denies mucositis or sore throat Respiratory: Denies cough, dyspnea or wheezes Cardiovascular: Denies  palpitation, chest discomfort or lower extremity swelling Gastrointestinal:  Denies nausea, heartburn or change in bowel habits Skin: Denies abnormal skin rashes Lymphatics: Denies new lymphadenopathy or easy bruising Neurological:Denies numbness, tingling or new weaknesses Behavioral/Psych: Mood is stable, no new changes  All other systems were reviewed with the patient and are negative.  PHYSICAL EXAMINATION: ECOG PERFORMANCE STATUS: 1 - Symptomatic but completely ambulatory  Filed Vitals:   03/03/15 1240  BP: 144/98  Pulse: 53  Temp: 98 F (36.7 C)  Resp: 18   Filed Weights   03/03/15 1240  Weight: 210 lb 14.4 oz (95.664 kg)    GENERAL:alert, no distress and comfortable. She is morbidly obese SKIN: skin color, texture, turgor are normal, no rashes or significant lesions EYES: normal, Conjunctiva are pale and non-injected, sclera clear OROPHARYNX:no exudate, no erythema and lips, buccal mucosa, and tongue normal  NECK: supple, thyroid normal size, non-tender, without nodularity LYMPH:  no palpable lymphadenopathy in the cervical, axillary or inguinal LUNGS: clear to auscultation and percussion with normal breathing effort HEART: regular rate & rhythm and no murmurs and no lower extremity edema ABDOMEN:abdomen soft, non-tender and normal bowel sounds Musculoskeletal:no cyanosis of digits and no clubbing  NEURO: alert & oriented x 3 with fluent speech, no focal motor/sensory deficits  LABORATORY DATA:  I have reviewed the data as listed No results found for this or any previous visit (from the past 48 hour(s)).  Lab Results  Component Value Date   WBC 3.3* 02/07/2015   HGB 10.5* 02/07/2015   HCT 32.6* 02/07/2015   MCV 65.9* 02/07/2015   PLT 257 02/07/2015    ASSESSMENT & PLAN:  Iron deficiency anemia, unspecified The most likely cause of her anemia is due to chronic blood loss/malabsorption syndrome. We discussed some of the risks, benefits, and alternatives of  intravenous iron infusions. The patient is symptomatic from anemia and the iron level is critically low. She tolerated oral iron supplement poorly and desires to achieved higher levels of iron faster for adequate hematopoesis. Some of the side-effects to be expected including risks of infusion reactions, phlebitis, headaches, nausea and fatigue.  The patient is willing to proceed. Patient education material was dispensed.  Goal is to keep ferritin level greater than 50   Tobacco abuse I spent some time counseling the patient the importance of tobacco cessation. she is currently attempting to quit on her own  Menorrhagia with regular cycle I recommend she has further discussion with her gynecologist about management of menorrhagia.   All questions were answered. The patient knows to call the clinic with any problems, questions or concerns. No barriers to learning was detected.  I spent 15 minutes counseling the patient face to face. The total time spent in the appointment was 20 minutes and more than 50% was on counseling.     Edrick Whitehorn, MD 3/2/20172:15 PM

## 2015-03-10 ENCOUNTER — Ambulatory Visit: Payer: Federal, State, Local not specified - PPO

## 2015-03-11 ENCOUNTER — Ambulatory Visit (HOSPITAL_BASED_OUTPATIENT_CLINIC_OR_DEPARTMENT_OTHER): Payer: Federal, State, Local not specified - PPO

## 2015-03-11 VITALS — BP 122/88 | HR 62 | Temp 98.8°F | Resp 18

## 2015-03-11 DIAGNOSIS — D509 Iron deficiency anemia, unspecified: Secondary | ICD-10-CM

## 2015-03-11 MED ORDER — SODIUM CHLORIDE 0.9 % IV SOLN
Freq: Once | INTRAVENOUS | Status: AC
  Administered 2015-03-11: 13:00:00 via INTRAVENOUS

## 2015-03-11 MED ORDER — SODIUM CHLORIDE 0.9 % IV SOLN
510.0000 mg | Freq: Once | INTRAVENOUS | Status: AC
  Administered 2015-03-11: 510 mg via INTRAVENOUS
  Filled 2015-03-11: qty 17

## 2015-03-11 NOTE — Patient Instructions (Addendum)
Ferumoxytol injection °What is this medicine? °FERUMOXYTOL is an iron complex. Iron is used to make healthy red blood cells, which carry oxygen and nutrients throughout the body. This medicine is used to treat iron deficiency anemia in people with chronic kidney disease. °This medicine may be used for other purposes; ask your health care provider or pharmacist if you have questions. °What should I tell my health care provider before I take this medicine? °They need to know if you have any of these conditions: °-anemia not caused by low iron levels °-high levels of iron in the blood °-magnetic resonance imaging (MRI) test scheduled °-an unusual or allergic reaction to iron, other medicines, foods, dyes, or preservatives °-pregnant or trying to get pregnant °-breast-feeding °How should I use this medicine? °This medicine is for injection into a vein. It is given by a health care professional in a hospital or clinic setting. °Talk to your pediatrician regarding the use of this medicine in children. Special care may be needed. °Overdosage: If you think you have taken too much of this medicine contact a poison control center or emergency room at once. °NOTE: This medicine is only for you. Do not share this medicine with others. °What if I miss a dose? °It is important not to miss your dose. Call your doctor or health care professional if you are unable to keep an appointment. °What may interact with this medicine? °This medicine may interact with the following medications: °-other iron products °This list may not describe all possible interactions. Give your health care provider a list of all the medicines, herbs, non-prescription drugs, or dietary supplements you use. Also tell them if you smoke, drink alcohol, or use illegal drugs. Some items may interact with your medicine. °What should I watch for while using this medicine? °Visit your doctor or healthcare professional regularly. Tell your doctor or healthcare  professional if your symptoms do not start to get better or if they get worse. You may need blood work done while you are taking this medicine. °You may need to follow a special diet. Talk to your doctor. Foods that contain iron include: whole grains/cereals, dried fruits, beans, or peas, leafy green vegetables, and organ meats (liver, kidney). °What side effects may I notice from receiving this medicine? °Side effects that you should report to your doctor or health care professional as soon as possible: °-allergic reactions like skin rash, itching or hives, swelling of the face, lips, or tongue °-breathing problems °-changes in blood pressure °-feeling faint or lightheaded, falls °-fever or chills °-flushing, sweating, or hot feelings °-swelling of the ankles or feet °Side effects that usually do not require medical attention (Report these to your doctor or health care professional if they continue or are bothersome.): °-diarrhea °-headache °-nausea, vomiting °-stomach pain °This list may not describe all possible side effects. Call your doctor for medical advice about side effects. You may report side effects to FDA at 1-800-FDA-1088. °Where should I keep my medicine? °This drug is given in a hospital or clinic and will not be stored at home. °NOTE: This sheet is a summary. It may not cover all possible information. If you have questions about this medicine, talk to your doctor, pharmacist, or health care provider. °  °© 2016, Elsevier/Gold Standard. (2011-08-03 15:23:36) ° °Anemia, Nonspecific °Anemia is a condition in which the concentration of red blood cells or hemoglobin in the blood is below normal. Hemoglobin is a substance in red blood cells that carries oxygen to the tissues   of the body. Anemia results in not enough oxygen reaching these tissues.  °CAUSES  °Common causes of anemia include:  °· Excessive bleeding. Bleeding may be internal or external. This includes excessive bleeding from periods (in  women) or from the intestine.   °· Poor nutrition.   °· Chronic kidney, thyroid, and liver disease.  °· Bone marrow disorders that decrease red blood cell production. °· Cancer and treatments for cancer. °· HIV, AIDS, and their treatments. °· Spleen problems that increase red blood cell destruction. °· Blood disorders. °· Excess destruction of red blood cells due to infection, medicines, and autoimmune disorders. °SIGNS AND SYMPTOMS  °· Minor weakness.   °· Dizziness.   °· Headache. °· Palpitations.   °· Shortness of breath, especially with exercise.   °· Paleness. °· Cold sensitivity. °· Indigestion. °· Nausea. °· Difficulty sleeping. °· Difficulty concentrating. °Symptoms may occur suddenly or they may develop slowly.  °DIAGNOSIS  °Additional blood tests are often needed. These help your health care provider determine the best treatment. Your health care provider will check your stool for blood and look for other causes of blood loss.  °TREATMENT  °Treatment varies depending on the cause of the anemia. Treatment can include:  °· Supplements of iron, vitamin B12, or folic acid.   °· Hormone medicines.   °· A blood transfusion. This may be needed if blood loss is severe.   °· Hospitalization. This may be needed if there is significant continual blood loss.   °· Dietary changes. °· Spleen removal. °HOME CARE INSTRUCTIONS °Keep all follow-up appointments. It often takes many weeks to correct anemia, and having your health care provider check on your condition and your response to treatment is very important. °SEEK IMMEDIATE MEDICAL CARE IF:  °· You develop extreme weakness, shortness of breath, or chest pain.   °· You become dizzy or have trouble concentrating. °· You develop heavy vaginal bleeding.   °· You develop a rash.   °· You have bloody or black, tarry stools.   °· You faint.   °· You vomit up blood.   °· You vomit repeatedly.   °· You have abdominal pain. °· You have a fever or persistent symptoms for more  than 2-3 days.   °· You have a fever and your symptoms suddenly get worse.   °· You are dehydrated.   °MAKE SURE YOU: °· Understand these instructions. °· Will watch your condition. °· Will get help right away if you are not doing well or get worse. °  °This information is not intended to replace advice given to you by your health care provider. Make sure you discuss any questions you have with your health care provider. °  °Document Released: 01/26/2004 Document Revised: 08/20/2012 Document Reviewed: 06/13/2012 °Elsevier Interactive Patient Education ©2016 Elsevier Inc. ° ° °

## 2015-03-24 ENCOUNTER — Other Ambulatory Visit: Payer: Self-pay | Admitting: Hematology and Oncology

## 2015-09-02 ENCOUNTER — Other Ambulatory Visit: Payer: Federal, State, Local not specified - PPO

## 2015-09-08 ENCOUNTER — Telehealth: Payer: Self-pay | Admitting: *Deleted

## 2015-09-08 NOTE — Telephone Encounter (Signed)
Called patient about missed lab appt. States she has to see PCP first before she can come to [facility]. Will call us back after she sees PCP.

## 2015-09-09 ENCOUNTER — Ambulatory Visit: Payer: Federal, State, Local not specified - PPO | Admitting: Hematology and Oncology

## 2015-09-09 ENCOUNTER — Ambulatory Visit: Payer: Federal, State, Local not specified - PPO

## 2015-10-06 ENCOUNTER — Encounter: Payer: Self-pay | Admitting: Emergency Medicine

## 2015-10-06 ENCOUNTER — Emergency Department
Admission: EM | Admit: 2015-10-06 | Discharge: 2015-10-06 | Disposition: A | Discharge status: Home / Self Care | Payer: Federal, State, Local not specified - PPO | Attending: Student | Admitting: Student

## 2015-10-06 DIAGNOSIS — I10 Essential (primary) hypertension: Secondary | ICD-10-CM | POA: Insufficient documentation

## 2015-10-06 DIAGNOSIS — R109 Unspecified abdominal pain: Secondary | ICD-10-CM | POA: Insufficient documentation

## 2015-10-06 DIAGNOSIS — F17200 Nicotine dependence, unspecified, uncomplicated: Secondary | ICD-10-CM | POA: Insufficient documentation

## 2015-10-06 DIAGNOSIS — R1110 Vomiting, unspecified: Secondary | ICD-10-CM

## 2015-10-06 DIAGNOSIS — R42 Dizziness and giddiness: Secondary | ICD-10-CM | POA: Diagnosis not present

## 2015-10-06 DIAGNOSIS — R112 Nausea with vomiting, unspecified: Secondary | ICD-10-CM | POA: Diagnosis not present

## 2015-10-06 LAB — URINALYSIS COMPLETE WITH MICROSCOPIC (ARMC ONLY)
BACTERIA UA: NONE SEEN
Bilirubin Urine: NEGATIVE
Glucose, UA: NEGATIVE mg/dL
Ketones, ur: NEGATIVE mg/dL
Leukocytes, UA: NEGATIVE
Nitrite: NEGATIVE
PROTEIN: NEGATIVE mg/dL
SPECIFIC GRAVITY, URINE: 1.025 (ref 1.005–1.030)
pH: 5 (ref 5.0–8.0)

## 2015-10-06 LAB — CBC WITH DIFFERENTIAL/PLATELET
BASOS PCT: 1 %
Basophils Absolute: 0 10*3/uL (ref 0–0.1)
Eosinophils Absolute: 0.1 10*3/uL (ref 0–0.7)
Eosinophils Relative: 3 %
HEMATOCRIT: 39.6 % (ref 35.0–47.0)
HEMOGLOBIN: 13 g/dL (ref 12.0–16.0)
Lymphocytes Relative: 34 %
Lymphs Abs: 1.2 10*3/uL (ref 1.0–3.6)
MCH: 22.1 pg — ABNORMAL LOW (ref 26.0–34.0)
MCHC: 32.9 g/dL (ref 32.0–36.0)
MCV: 67.3 fL — ABNORMAL LOW (ref 80.0–100.0)
MONOS PCT: 7 %
Monocytes Absolute: 0.3 10*3/uL (ref 0.2–0.9)
NEUTROS ABS: 1.9 10*3/uL (ref 1.4–6.5)
NEUTROS PCT: 55 %
Platelets: 211 10*3/uL (ref 150–440)
RBC: 5.89 MIL/uL — ABNORMAL HIGH (ref 3.80–5.20)
RDW: 15.3 % — ABNORMAL HIGH (ref 11.5–14.5)
WBC: 3.5 10*3/uL — ABNORMAL LOW (ref 3.6–11.0)

## 2015-10-06 LAB — COMPREHENSIVE METABOLIC PANEL
ALBUMIN: 4.1 g/dL (ref 3.5–5.0)
ALK PHOS: 52 U/L (ref 38–126)
ALT: 10 U/L — AB (ref 14–54)
AST: 15 U/L (ref 15–41)
Anion gap: 7 (ref 5–15)
BILIRUBIN TOTAL: 0.7 mg/dL (ref 0.3–1.2)
BUN: 12 mg/dL (ref 6–20)
CALCIUM: 9 mg/dL (ref 8.9–10.3)
CO2: 27 mmol/L (ref 22–32)
CREATININE: 0.84 mg/dL (ref 0.44–1.00)
Chloride: 103 mmol/L (ref 101–111)
GFR calc Af Amer: >60 mL/min (ref >60)
GFR calc non Af Amer: >60 mL/min (ref >60)
GLUCOSE: 77 mg/dL (ref 65–99)
Potassium: 3.2 mmol/L — ABNORMAL LOW (ref 3.5–5.1)
SODIUM: 137 mmol/L (ref 135–145)
TOTAL PROTEIN: 7.6 g/dL (ref 6.5–8.1)

## 2015-10-06 LAB — LIPASE, BLOOD: Lipase: 24 U/L (ref 11–51)

## 2015-10-06 LAB — POCT PREGNANCY, URINE: PREG TEST UR: NEGATIVE

## 2015-10-06 MED ORDER — SODIUM CHLORIDE 0.9 % IV BOLUS (SEPSIS)
1000.0000 mL | Freq: Once | INTRAVENOUS | Status: AC
  Administered 2015-10-06: 1000 mL via INTRAVENOUS

## 2015-10-06 MED ORDER — ONDANSETRON HCL 4 MG/2ML IJ SOLN
4.0000 mg | Freq: Once | INTRAMUSCULAR | Status: DC
  Filled 2015-10-06: qty 2

## 2015-10-06 NOTE — ED Notes (Signed)
Pt resting in bed at this time with husband at bedside. NAD noted at this time. Pt able to give urine specimen, pt receiving fluids via IV. Will continue to monitor for further patient needs.

## 2015-10-06 NOTE — ED Triage Notes (Signed)
Pt to ed with c/o vomiting and weakness x several days,  Pt reports decreased intake and decreased urine output.

## 2015-10-06 NOTE — ED Notes (Signed)
NAD noted at time of D/C. Pt denies questions or concerns. Pt ambulatory to the lobby at this time.  

## 2015-10-06 NOTE — ED Notes (Signed)
This RN obtained EKG from patient per MD order. Pt is visualized in NAD at this time. Will continue to monitor for further patient needs.

## 2015-10-06 NOTE — ED Provider Notes (Signed)
[facility] Emergency Department Provider Note   ____________________________________________  Approximate time seen 10 AM  I have reviewed the triage vital signs and the nursing notes.   HISTORY  Chief Complaint Emesis and Weakness    HPI Virginia Webb is a 40 y.o. female with history of bipolar disorder, hypertension, hyperlipidemia, Cooley's anemia previously requiring transfusions, chronic abdominal pain and nausea/vomiting who presents for evaluation of 2-3 days of increased nonbloody nonbilious emesis as well as generalized weakness, constant, moderate, no modifying factors. She reports that she has had vomiting on and off from time to time since her lap band surgery, she has had increase in the frequency of vomiting over the past 2-3 days. She is complaining of her chronic abdominal pain which is unchanged from prior and is mild. She denies any diarrhea, fevers or chills. No chest pain or difficulty breathing. She reports she feels lightheaded and dehydrated but denies any room spinning dizziness, headache, vision change, numbness or weakness in the arms or legs.   Past Medical History:  Diagnosis Date  . Anxiety and depression   . Bipolar affective disorder ([place])   . Fatigue   . Hyperlipidemia   . Hypertension   . Iron deficiency anemia   . Iron deficiency anemia, unspecified 06/06/2012  . Lung nodules   . Menorrhagia     Patient Active Problem List   Diagnosis Date Noted  . Menorrhagia with regular cycle 03/03/2015  . Thalassemia, beta ([place]) 06/11/2013  . Tobacco abuse 06/11/2013  . Morbid obesity ([place]) 06/19/2012  . Iron deficiency anemia, unspecified 06/06/2012  . Fatigue   . Anemia   . Anxiety and depression     Past Surgical History:  Procedure Laterality Date  . CESAREAN SECTION  01/31/1995  . CESAREAN SECTION  10/22/2000  . Lap Band    . TUBAL LIGATION      Prior to Admission medications   Medication Sig Start Date End  Date Taking? Authorizing Provider  ferrous sulfate 220 (44 Fe) MG/5ML solution Take 325 mg by mouth daily.     Historical Provider, MD  FLUoxetine (PROZAC) 20 MG/5ML solution Take 20 mg by mouth daily.    Historical Provider, MD  Multiple Vitamins-Minerals (MULTIVITAMIN ADULT) CHEW Chew by mouth daily.    Historical Provider, MD    Allergies Review of patient's allergies indicates no known allergies.  Family History  Problem Relation Age of Onset  . Alcohol abuse Father     Social History Social History  Substance Use Topics  . Smoking status: Current Every Day Smoker    Packs/day: 1.00    Years: 10.00  . Smokeless tobacco: Never Used  . Alcohol use Yes     Comment: social    Review of Systems Constitutional: No fever/chills Eyes: No visual changes. ENT: No sore throat. Cardiovascular: Denies chest pain. Respiratory: Denies shortness of breath. Gastrointestinal: + abdominal pain.  + nausea, + vomiting.  No diarrhea.  No constipation. Genitourinary: Negative for dysuria. Musculoskeletal: Negative for back pain. Skin: Negative for rash. Neurological: Negative for headaches, focal weakness or numbness.  10-point ROS otherwise negative.  ____________________________________________   PHYSICAL EXAM:  Vitals:   10/06/15 0905 10/06/15 0930 10/06/15 1000 10/06/15 1030  BP: (!) 169/111 (!) 142/101 (!) 145/101 (!) 152/96  Pulse: (!) 51 (!) 52 (!) 52 (!) 47  Resp: 10 15 17 20   Temp:      TempSrc:      SpO2: 100% 100% 100% 100%  Weight:  VITAL SIGNS: ED Triage Vitals  Enc Vitals Group     BP 10/06/15 0848 (!) 157/102     Pulse Rate 10/06/15 0848 (!) 53     Resp 10/06/15 0848 18     Temp 10/06/15 0848 98.1 F (36.7 C)     Temp Source 10/06/15 0848 Oral     SpO2 10/06/15 0848 100 %     Weight 10/06/15 0843 210 lb (95.3 kg)     Height --      Head Circumference --      Peak Flow --      Pain Score 10/06/15 0843 3     Pain Loc --      Pain Edu? --       Excl. in [place]? --     Constitutional: Alert and oriented. Well appearing and in no acute distress. Eyes: Conjunctivae are normal. PERRL. EOMI. Head: Atraumatic. Nose: No congestion/rhinnorhea. Mouth/Throat: Mucous membranes are moist.  Oropharynx non-erythematous. Neck: No stridor.   Cardiovascular: Normal rate, regular rhythm. Grossly normal heart sounds.  Good peripheral circulation. Respiratory: Normal respiratory effort.  No retractions. Lungs CTAB. Gastrointestinal: Soft and nontender. No distention.  No CVA tenderness. Genitourinary: deferred Musculoskeletal: No lower extremity tenderness nor edema.  No joint effusions. Neurologic:  Normal speech and language. No gross focal neurologic deficits are appreciated. No gait instability. Skin:  Skin is warm, dry and intact. No rash noted. Psychiatric: Mood and affect are normal. Speech and behavior are normal.  ____________________________________________   LABS (all labs ordered are listed, but only abnormal results are displayed)  Labs Reviewed  CBC WITH DIFFERENTIAL/PLATELET - Abnormal; Notable for the following:       Result Value   WBC 3.5 (*)    RBC 5.89 (*)    MCV 67.3 (*)    MCH 22.1 (*)    RDW 15.3 (*)    All other components within normal limits  COMPREHENSIVE METABOLIC PANEL - Abnormal; Notable for the following:    Potassium 3.2 (*)    ALT 10 (*)    All other components within normal limits  URINALYSIS COMPLETEWITH MICROSCOPIC ([facility] ONLY) - Abnormal; Notable for the following:    Color, Urine YELLOW (*)    APPearance CLEAR (*)    Hgb urine dipstick 1+ (*)    Squamous Epithelial / LPF 0-5 (*)    All other components within normal limits  LIPASE, BLOOD  POC URINE PREG, ED  POCT PREGNANCY, URINE   ____________________________________________  EKG  ED ECG REPORT I, Joanne Gavel, the attending physician, personally viewed and interpreted this ECG.   Date: 10/06/2015  EKG Time: 11:43  Rate: 56  Rhythm:  sinus bradycardia with slightly prolonged PR interval at 220 ms  Axis: normal  Intervals:none  ST&T Change: No acute ST elevation or acute ST depression.  ____________________________________________  RADIOLOGY  none ____________________________________________   PROCEDURES  Procedure(s) performed: None  Procedures  Critical Care performed: No  ____________________________________________   INITIAL IMPRESSION / ASSESSMENT AND PLAN / ED COURSE  Pertinent labs & imaging results that were available during my care of the patient were reviewed by me and considered in my medical decision making (see chart for details).  Virginia Webb is a 40 y.o. female with history of bipolar disorder, hypertension, hyperlipidemia, "please anemia previously requiring transfusions, chronic abdominal pain and nausea/vomiting who presents for evaluation of 2-3 days of increased nonbloody nonbilious emesis as well as generalized weakness. Predominantly, she reports she feels hydrated and "  when this happens to me I just need IV fluids". She is very well-appearing and in no acute distress. Her vital signs are stable, she is afebrile per she is benign abdominal examination. Plan for screening labs, urinalysis, urine pregnancy test, we'll give IV fluids and reassess for disposition. She has a follow-up appointment scheduled for this afternoon with her primary care doctor.  ----------------------------------------- 11:37 AM on 10/06/2015 ----------------------------------------- Patient reports she feels "so much better" at this time after IV fluids. I reviewed her labs. CBC with mild leukopenia WBC 3.5 but otherwise unchanged from prior. CMP is generally unremarkable, normal lipase. Urine analysis back with infection, negative pregnancy test. We discussed continuous return precautions and need for close PCP follow-up. She is mildly bradycardic at the time of discharge which she reports is chronic for her, she  is asymptomatic and maintaining adequate blood pressure. She is tolerating PO intake without vomiting.   Clinical Course     ____________________________________________   FINAL CLINICAL IMPRESSION(S) / ED DIAGNOSES  Final diagnoses:  Non-intractable vomiting, presence of nausea not specified, unspecified vomiting type  Lightheadedness      NEW MEDICATIONS STARTED DURING THIS VISIT:  New Prescriptions   No medications on file     Note:  This document was prepared using Dragon voice recognition software and may include unintentional dictation errors.    Joanne Gavel, MD 10/06/15 [phone]

## 2016-05-03 ENCOUNTER — Encounter (HOSPITAL_COMMUNITY): Payer: Self-pay

## 2016-08-01 NOTE — H&P (Addendum)
Caroline Sauger, MD  Obstetrics and Gynecology    [] Hide copied text  Chief Complaint:    [name] is a 41 y.o. female here formenorrhagia + fibroids  . pt G2P2 with a few yr h/o menorrhagia . Pt states she has monthly cycles bleeds heavily for 3 days . Soaks clothes . ++ dysmenorrhea , no dyspareunia . 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  Past Surgical History:  has a past surgical history that includes stomach surgery post csection complications- life support 5 weeks; Laparoscopic Gastroplasty Non-Vertical Banding For Obesity; Cesarean section; and Tubal ligation. Family History: family history includes High blood pressure (Hypertension) in her brother and brother; Mental illness in her father. Social History:  reports that she has been smoking Cigarettes.  She has a 3.00 pack-year smoking history. She has never used smokeless tobacco. She reports that she drinks alcohol. She reports that she does not use drugs. OB/GYN History:          OB History    Gravida Para Term Preterm AB Living   2 2       2    SAB TAB Ectopic Molar Multiple Live Births             2      Allergies: has No Known Allergies. Medications:  Current Outpatient Prescriptions:  .  divalproex (DEPAKOTE SPRINKLE) 125 mg DR capsule, Take 1 capsule (125 mg total) by mouth 2 (two) times daily., Disp: 60 capsule, Rfl: 1 .  FERROUS SULFATE, DRIED (IRON, DRIED, ORAL), Take by mouth. Per [facility] . They RX this., Disp: , Rfl:  .  metroNIDAZOLE (FLAGYL) 500 MG tablet, Take 1 tablet (500 mg total) by mouth 2 (two)  times daily., Disp: 14 tablet, Rfl: 0  Current Facility-Administered Medications:  .  cyanocobalamin (VITAMIN B12) injection 1,000 mcg, 1,000 mcg, Intramuscular, Q30 Days, Denyse Amass, MD, 1,000 mcg at 02/21/15 1103  Review of Systems: General:                      No fatigue or weight loss Eyes:                           No vision changes Ears:                            No hearing difficulty Respiratory:                No cough or shortness of breath Pulmonary:                  No asthma or shortness of breath Cardiovascular:           No chest pain, palpitations, dyspnea on exertion Gastrointestinal:          No abdominal bloating, chronic diarrhea, constipations, masses, pain or hematochezia Genitourinary:             No hematuria, dysuria, abnormal vaginal discharge, pelvic pain, Menometrorrhagia Lymphatic:  No swollen lymph nodes Musculoskeletal:         No muscle weakness Neurologic:                  No extremity weakness, syncope, seizure disorder Psychiatric:                  No history of depression, delusions or suicidal/homicidal ideation    Exam:   There were no vitals filed for this visit.  There is no height or weight on file to calculate BMI.  WDWN/ black female in NAD   Lungs: CTA  CV : RRR without murmur   Breast: exam done in sitting and lying position : No dimpling or retraction, no dominant mass, no spontaneous discharge, no axillary adenopathy Neck:  no thyromegaly Abdomen: soft , no mass, normal active bowel sounds,  non-tender, no rebound tenderness Pelvic: tanner stage 5 ,  External genitalia: vulva /labia no lesions Urethra: no prolapse Vagina: normal physiologic d/c Cervix: no lesions, no cervical motion tenderness   Uterus: 16 weeks , non-tender Adnexa: no mass,  non-tender   Rectovaginal: no mass heme negative  Impression:   The primary encounter diagnosis was Menorrhagia with irregular cycle. Diagnoses of  Intramural and submucous leiomyoma of uterus and H/O iron deficiency anemia were also pertinent to this visit.    Plan:   I have spoken with the patient regarding treatment options including expectant management, hormonal options, or surgical intervention. After a full discussion the pt elects to proceed with [facility] and bilateral salpingectomy  Benefits and risks to surgery: The proposed benefit of the surgery has been discussed with the patient. The possible risks include, but are not limited to: organ injury to the bowel , bladder, ureters, and major blood vessels and nerves. There is a possibility of additional surgeries resulting from these injuries. There is also the risk of blood transfusion and the need to receive blood products during or after the procedure which may rarely lead to HIV or Hepatitis C infection. There is a risk of developing a deep venous thrombosis or a pulmonary embolism . There is the possibility of wound infection and also anesthetic complications, even the rare possibility of death. The patient understands these risks and wishes to proceed. All questions have been answered and the consent has been signed. She has been counseled regarding the risk of occult sarcoma in fibroids and the risk of upstaging .

## 2016-08-02 ENCOUNTER — Encounter
Admission: RE | Admit: 2016-08-02 | Discharge: 2016-08-02 | Disposition: A | Discharge status: Home / Self Care | Payer: Federal, State, Local not specified - PPO | Source: Ambulatory Visit | Attending: Obstetrics and Gynecology | Admitting: Obstetrics and Gynecology

## 2016-08-02 DIAGNOSIS — D259 Leiomyoma of uterus, unspecified: Secondary | ICD-10-CM | POA: Insufficient documentation

## 2016-08-02 DIAGNOSIS — R001 Bradycardia, unspecified: Secondary | ICD-10-CM | POA: Diagnosis not present

## 2016-08-02 DIAGNOSIS — I10 Essential (primary) hypertension: Secondary | ICD-10-CM | POA: Diagnosis not present

## 2016-08-02 DIAGNOSIS — Z01812 Encounter for preprocedural laboratory examination: Secondary | ICD-10-CM | POA: Diagnosis not present

## 2016-08-02 DIAGNOSIS — Z01818 Encounter for other preprocedural examination: Secondary | ICD-10-CM | POA: Diagnosis not present

## 2016-08-02 DIAGNOSIS — Z0183 Encounter for blood typing: Secondary | ICD-10-CM | POA: Insufficient documentation

## 2016-08-02 DIAGNOSIS — I440 Atrioventricular block, first degree: Secondary | ICD-10-CM | POA: Diagnosis not present

## 2016-08-02 DIAGNOSIS — N920 Excessive and frequent menstruation with regular cycle: Secondary | ICD-10-CM | POA: Diagnosis not present

## 2016-08-02 HISTORY — DX: Cerebral infarction, unspecified: I63.9

## 2016-08-02 HISTORY — DX: Gastro-esophageal reflux disease without esophagitis: K21.9

## 2016-08-02 HISTORY — DX: Neoplasm of unspecified behavior of other genitourinary organ: D49.59

## 2016-08-02 LAB — CBC
HEMATOCRIT: 30.9 % — AB (ref 35.0–47.0)
Hemoglobin: 9.9 g/dL — ABNORMAL LOW (ref 12.0–16.0)
MCH: 21.1 pg — ABNORMAL LOW (ref 26.0–34.0)
MCHC: 32.1 g/dL (ref 32.0–36.0)
MCV: 65.9 fL — ABNORMAL LOW (ref 80.0–100.0)
Platelets: 230 10*3/uL (ref 150–440)
RBC: 4.69 MIL/uL (ref 3.80–5.20)
RDW: 15.4 % — AB (ref 11.5–14.5)
WBC: 2.8 10*3/uL — AB (ref 3.6–11.0)

## 2016-08-02 LAB — TYPE AND SCREEN
ABO/RH(D): AB POS
ANTIBODY SCREEN: NEGATIVE

## 2016-08-02 LAB — BASIC METABOLIC PANEL
ANION GAP: 6 (ref 5–15)
BUN: 14 mg/dL (ref 6–20)
CO2: 29 mmol/L (ref 22–32)
Calcium: 8.7 mg/dL — ABNORMAL LOW (ref 8.9–10.3)
Chloride: 103 mmol/L (ref 101–111)
Creatinine, Ser: 0.8 mg/dL (ref 0.44–1.00)
Glucose, Bld: 80 mg/dL (ref 65–99)
Potassium: 3.6 mmol/L (ref 3.5–5.1)
SODIUM: 138 mmol/L (ref 135–145)

## 2016-08-02 MED ORDER — FLEET ENEMA 7-19 GM/118ML RE ENEM
1.0000 | ENEMA | Freq: Once | RECTAL | Status: DC
  Filled 2016-08-02: qty 1

## 2016-08-02 NOTE — Patient Instructions (Signed)
Your procedure is scheduled on: 08/13/16 Mon Report to Same Day Surgery 2nd floor medical mall [facility] Entrance-take elevator on left to 2nd floor.  Check in with surgery information desk.) To find out your arrival time please call [phone] between 1PM - 3PM on 08/10/16 Fri  Remember: Instructions that are not followed completely may result in serious medical risk, up to and including death, or upon the discretion of your surgeon and anesthesiologist your surgery may need to be rescheduled.    _x___ 1. Do not eat food or drink liquids after midnight. No gum chewing or                              hard candies.     __x__ 2. No Alcohol for 24 hours before or after surgery.   __x__3. No Smoking for 24 prior to surgery.   ____  4. Bring all medications with you on the day of surgery if instructed.    __x__ 5. Notify your doctor if there is any change in your medical condition     (cold, fever, infections).     Do not wear jewelry, make-up, hairpins, clips or nail polish.  Do not wear lotions, powders, or perfumes. You may wear deodorant.  Do not shave 48 hours prior to surgery. Men may shave face and neck.  Do not bring valuables to the hospital.    [facility] is not responsible for any belongings or valuables.               Contacts, dentures or bridgework may not be worn into surgery.  Leave your suitcase in the car. After surgery it may be brought to your room.  For patients admitted to the hospital, discharge time is determined by your                       treatment team.   Patients discharged the day of surgery will not be allowed to drive home.  You will need someone to drive you home and stay with you the night of your procedure.    Please read over the following fact sheets that you were given:   [facility] Preparing for Surgery and or MRSA Information   _x___ Take anti-hypertensive (unless it includes a diuretic), cardiac, seizure, asthma,     anti-reflux and  psychiatric medicines. These include:  1. divalproex (DEPAKOTE SPRINKLE)   2.  3.  4.  5.  6.  ____Fleets enema or Magnesium Citrate as directed.   _x___ Use CHG Soap or sage wipes as directed on instruction sheet   ____ Use inhalers on the day of surgery and bring to hospital day of surgery  ____ Stop Metformin and Janumet 2 days prior to surgery.    ____ Take 1/2 of usual insulin dose the night before surgery and none on the morning     surgery.   _x___ Follow recommendations from Cardiologist, Pulmonologist or PCP regarding          stopping Aspirin, Coumadin, Pllavix ,Eliquis, Effient, or Pradaxa, and Pletal.  X____Stop Anti-inflammatories such as Advil, Aleve, Ibuprofen, Motrin, Naproxen, Naprosyn, Goodies powders or aspirin products. OK to take Tylenol and                          Celebrex.   _x___ Stop supplements until after surgery.  But may continue Vitamin  D, Vitamin B,       and multivitamin.   ____ Bring C-Pap to the hospital.

## 2016-08-12 MED ORDER — CEFOXITIN SODIUM-DEXTROSE 2-2.2 GM-% IV SOLR (PREMIX)
2.0000 g | INTRAVENOUS | Status: AC
  Administered 2016-08-13: 2 g via INTRAVENOUS

## 2016-08-13 ENCOUNTER — Other Ambulatory Visit: Payer: Self-pay | Admitting: Obstetrics and Gynecology

## 2016-08-13 ENCOUNTER — Ambulatory Visit: Payer: Federal, State, Local not specified - PPO | Admitting: Certified Registered"

## 2016-08-13 ENCOUNTER — Observation Stay
Admission: RE | Admit: 2016-08-13 | Discharge: 2016-08-14 | Disposition: A | Discharge status: Home / Self Care | Payer: Federal, State, Local not specified - PPO | Source: Ambulatory Visit | Attending: Obstetrics and Gynecology | Admitting: Obstetrics and Gynecology

## 2016-08-13 ENCOUNTER — Encounter
Admission: RE | Disposition: A | Discharge status: Home / Self Care | Payer: Self-pay | Source: Ambulatory Visit | Attending: Obstetrics and Gynecology

## 2016-08-13 ENCOUNTER — Encounter: Payer: Self-pay | Admitting: *Deleted

## 2016-08-13 DIAGNOSIS — Z8673 Personal history of transient ischemic attack (TIA), and cerebral infarction without residual deficits: Secondary | ICD-10-CM | POA: Diagnosis not present

## 2016-08-13 DIAGNOSIS — N736 Female pelvic peritoneal adhesions (postinfective): Secondary | ICD-10-CM | POA: Insufficient documentation

## 2016-08-13 DIAGNOSIS — Z9884 Bariatric surgery status: Secondary | ICD-10-CM | POA: Insufficient documentation

## 2016-08-13 DIAGNOSIS — N920 Excessive and frequent menstruation with regular cycle: Secondary | ICD-10-CM | POA: Diagnosis present

## 2016-08-13 DIAGNOSIS — Z9889 Other specified postprocedural states: Secondary | ICD-10-CM

## 2016-08-13 DIAGNOSIS — F17210 Nicotine dependence, cigarettes, uncomplicated: Secondary | ICD-10-CM | POA: Insufficient documentation

## 2016-08-13 DIAGNOSIS — F319 Bipolar disorder, unspecified: Secondary | ICD-10-CM | POA: Diagnosis not present

## 2016-08-13 DIAGNOSIS — D509 Iron deficiency anemia, unspecified: Secondary | ICD-10-CM | POA: Insufficient documentation

## 2016-08-13 DIAGNOSIS — I10 Essential (primary) hypertension: Secondary | ICD-10-CM | POA: Diagnosis not present

## 2016-08-13 DIAGNOSIS — D251 Intramural leiomyoma of uterus: Secondary | ICD-10-CM | POA: Insufficient documentation

## 2016-08-13 HISTORY — PX: LAPAROSCOPIC UNILATERAL SALPINGECTOMY: SHX5934

## 2016-08-13 HISTORY — PX: LAPAROSCOPIC LYSIS OF ADHESIONS: SHX5905

## 2016-08-13 HISTORY — PX: LAPAROSCOPIC SUPRACERVICAL HYSTERECTOMY: SHX5399

## 2016-08-13 HISTORY — PX: CYSTOSCOPY: SHX5120

## 2016-08-13 LAB — POCT PREGNANCY, URINE: Preg Test, Ur: NEGATIVE

## 2016-08-13 LAB — ABO/RH: ABO/RH(D): AB POS

## 2016-08-13 SURGERY — HYSTERECTOMY, SUPRACERVICAL, LAPAROSCOPIC
Anesthesia: General | Site: Bladder | Laterality: Right | Wound class: Clean Contaminated

## 2016-08-13 MED ORDER — PROPOFOL 10 MG/ML IV BOLUS
INTRAVENOUS | Status: DC | PRN
  Administered 2016-08-13: 150 mg via INTRAVENOUS
  Administered 2016-08-13: 50 mg via INTRAVENOUS

## 2016-08-13 MED ORDER — MORPHINE SULFATE (PF) 2 MG/ML IV SOLN
1.0000 mg | INTRAVENOUS | Status: DC | PRN
  Administered 2016-08-13: 2 mg via INTRAVENOUS
  Filled 2016-08-13: qty 1

## 2016-08-13 MED ORDER — ONDANSETRON HCL 4 MG/2ML IJ SOLN
INTRAMUSCULAR | Status: DC | PRN
  Administered 2016-08-13: 4 mg via INTRAVENOUS

## 2016-08-13 MED ORDER — GLYCOPYRROLATE 0.2 MG/ML IJ SOLN
INTRAMUSCULAR | Status: AC
  Filled 2016-08-13: qty 1

## 2016-08-13 MED ORDER — LACTATED RINGERS IV SOLN
INTRAVENOUS | Status: DC
  Administered 2016-08-13 – 2016-08-14 (×2): via INTRAVENOUS

## 2016-08-13 MED ORDER — LIDOCAINE HCL (PF) 2 % IJ SOLN
INTRAMUSCULAR | Status: AC
  Filled 2016-08-13: qty 2

## 2016-08-13 MED ORDER — ONDANSETRON HCL 4 MG/2ML IJ SOLN: 4.0000 mg | Freq: Once | INTRAMUSCULAR | Status: DC | PRN

## 2016-08-13 MED ORDER — KETOROLAC TROMETHAMINE 30 MG/ML IJ SOLN
30.0000 mg | Freq: Three times a day (TID) | INTRAMUSCULAR | Status: DC | PRN
  Administered 2016-08-13: 30 mg via INTRAVENOUS
  Filled 2016-08-13 (×2): qty 1

## 2016-08-13 MED ORDER — ROCURONIUM BROMIDE 100 MG/10ML IV SOLN
INTRAVENOUS | Status: DC | PRN
  Administered 2016-08-13 (×3): 10 mg via INTRAVENOUS
  Administered 2016-08-13: 40 mg via INTRAVENOUS
  Administered 2016-08-13 (×3): 10 mg via INTRAVENOUS

## 2016-08-13 MED ORDER — ONDANSETRON HCL 4 MG/2ML IJ SOLN: 4.0000 mg | Freq: Four times a day (QID) | INTRAMUSCULAR | Status: DC | PRN

## 2016-08-13 MED ORDER — CEFOXITIN SODIUM-DEXTROSE 2-2.2 GM-% IV SOLR (PREMIX)
INTRAVENOUS | Status: AC
  Filled 2016-08-13: qty 50

## 2016-08-13 MED ORDER — DEXAMETHASONE SODIUM PHOSPHATE 10 MG/ML IJ SOLN
INTRAMUSCULAR | Status: DC | PRN
  Administered 2016-08-13: 10 mg via INTRAVENOUS

## 2016-08-13 MED ORDER — FENTANYL CITRATE (PF) 100 MCG/2ML IJ SOLN
INTRAMUSCULAR | Status: AC
  Administered 2016-08-13: 25 ug via INTRAVENOUS
  Filled 2016-08-13: qty 2

## 2016-08-13 MED ORDER — HYDROCODONE-ACETAMINOPHEN 7.5-325 MG/15ML PO SOLN
15.0000 mL | ORAL | Status: DC | PRN
  Administered 2016-08-13 – 2016-08-14 (×3): 15 mL via ORAL
  Filled 2016-08-13 (×5): qty 15

## 2016-08-13 MED ORDER — LACTATED RINGERS IV SOLN
INTRAVENOUS | Status: DC
  Administered 2016-08-13 (×2): via INTRAVENOUS

## 2016-08-13 MED ORDER — LIDOCAINE HCL (CARDIAC) 20 MG/ML IV SOLN
INTRAVENOUS | Status: DC | PRN
  Administered 2016-08-13: 50 mg via INTRAVENOUS

## 2016-08-13 MED ORDER — FENTANYL CITRATE (PF) 100 MCG/2ML IJ SOLN
25.0000 ug | INTRAMUSCULAR | Status: DC | PRN
  Administered 2016-08-13 (×2): 25 ug via INTRAVENOUS

## 2016-08-13 MED ORDER — ROCURONIUM BROMIDE 50 MG/5ML IV SOLN
INTRAVENOUS | Status: AC
  Filled 2016-08-13: qty 1

## 2016-08-13 MED ORDER — DEXAMETHASONE SODIUM PHOSPHATE 10 MG/ML IJ SOLN
INTRAMUSCULAR | Status: AC
  Filled 2016-08-13: qty 1

## 2016-08-13 MED ORDER — FLUORESCEIN SODIUM 10 % IV SOLN
INTRAVENOUS | Status: DC | PRN
  Administered 2016-08-13: .5 mL via INTRAVENOUS

## 2016-08-13 MED ORDER — MIDAZOLAM HCL 2 MG/2ML IJ SOLN
INTRAMUSCULAR | Status: DC | PRN
  Administered 2016-08-13: 2 mg via INTRAVENOUS

## 2016-08-13 MED ORDER — KETOROLAC TROMETHAMINE 30 MG/ML IJ SOLN
INTRAMUSCULAR | Status: AC
  Filled 2016-08-13: qty 1

## 2016-08-13 MED ORDER — GLYCOPYRROLATE 0.2 MG/ML IJ SOLN
INTRAMUSCULAR | Status: DC | PRN
  Administered 2016-08-13: 0.2 mg via INTRAVENOUS

## 2016-08-13 MED ORDER — ONDANSETRON HCL 4 MG/2ML IJ SOLN
INTRAMUSCULAR | Status: AC
  Filled 2016-08-13: qty 2

## 2016-08-13 MED ORDER — LACTATED RINGERS IV SOLN: INTRAVENOUS | Status: DC

## 2016-08-13 MED ORDER — FENTANYL CITRATE (PF) 100 MCG/2ML IJ SOLN
INTRAMUSCULAR | Status: DC | PRN
  Administered 2016-08-13 (×7): 50 ug via INTRAVENOUS

## 2016-08-13 MED ORDER — FENTANYL CITRATE (PF) 250 MCG/5ML IJ SOLN
INTRAMUSCULAR | Status: AC
  Filled 2016-08-13: qty 5

## 2016-08-13 MED ORDER — FAMOTIDINE 20 MG PO TABS: 20.0000 mg | ORAL_TABLET | Freq: Once | ORAL | Status: DC

## 2016-08-13 MED ORDER — SUGAMMADEX SODIUM 200 MG/2ML IV SOLN
INTRAVENOUS | Status: DC | PRN
  Administered 2016-08-13: 180 mg via INTRAVENOUS

## 2016-08-13 MED ORDER — METHYLENE BLUE 0.5 % INJ SOLN
INTRAVENOUS | Status: AC
  Filled 2016-08-13: qty 10

## 2016-08-13 MED ORDER — SUGAMMADEX SODIUM 200 MG/2ML IV SOLN
INTRAVENOUS | Status: AC
  Filled 2016-08-13: qty 2

## 2016-08-13 MED ORDER — SIMETHICONE 80 MG PO CHEW
80.0000 mg | CHEWABLE_TABLET | Freq: Four times a day (QID) | ORAL | Status: DC | PRN
  Administered 2016-08-14: 80 mg via ORAL
  Filled 2016-08-13: qty 1

## 2016-08-13 MED ORDER — ACETAMINOPHEN NICU IV SYRINGE 10 MG/ML
INTRAVENOUS | Status: AC
  Filled 2016-08-13: qty 1

## 2016-08-13 MED ORDER — STERILE WATER FOR IRRIGATION IR SOLN: Status: DC | PRN

## 2016-08-13 MED ORDER — BUPIVACAINE HCL 0.5 % IJ SOLN
INTRAMUSCULAR | Status: DC | PRN
  Administered 2016-08-13: 12 mL

## 2016-08-13 MED ORDER — MIDAZOLAM HCL 2 MG/2ML IJ SOLN
INTRAMUSCULAR | Status: AC
  Filled 2016-08-13: qty 2

## 2016-08-13 MED ORDER — KETOROLAC TROMETHAMINE 30 MG/ML IJ SOLN
INTRAMUSCULAR | Status: DC | PRN
  Administered 2016-08-13: 30 mg via INTRAVENOUS

## 2016-08-13 MED ORDER — FENTANYL CITRATE (PF) 100 MCG/2ML IJ SOLN
INTRAMUSCULAR | Status: AC
  Filled 2016-08-13: qty 2

## 2016-08-13 MED ORDER — PROPOFOL 10 MG/ML IV BOLUS
INTRAVENOUS | Status: AC
  Filled 2016-08-13: qty 20

## 2016-08-13 MED ORDER — ACETAMINOPHEN 10 MG/ML IV SOLN
INTRAVENOUS | Status: DC | PRN
  Administered 2016-08-13: 1000 mg via INTRAVENOUS

## 2016-08-13 MED ORDER — OXYCODONE-ACETAMINOPHEN 5-325 MG PO TABS: 1.0000 | ORAL_TABLET | ORAL | Status: DC | PRN

## 2016-08-13 MED ORDER — ONDANSETRON HCL 4 MG PO TABS: 4.0000 mg | ORAL_TABLET | Freq: Four times a day (QID) | ORAL | Status: DC | PRN

## 2016-08-13 SURGICAL SUPPLY — 41 items
BAG URO DRAIN 2000ML W/SPOUT (MISCELLANEOUS) ×12 IMPLANT
BLADE SURG SZ11 CARB STEEL (BLADE) ×6 IMPLANT
CANISTER SUCT 1200ML W/VALVE (MISCELLANEOUS) ×6 IMPLANT
CATH FOLEY 2WAY  5CC 16FR (CATHETERS) ×1
CATH URTH 16FR FL 2W BLN LF (CATHETERS) ×5 IMPLANT
CHLORAPREP W/TINT 26ML (MISCELLANEOUS) ×6 IMPLANT
DRSG TEGADERM 2-3/8X2-3/4 SM (GAUZE/BANDAGES/DRESSINGS) ×18 IMPLANT
GAUZE SPONGE NON-WVN 2X2 STRL (MISCELLANEOUS) ×10 IMPLANT
GLOVE BIO SURGEON STRL SZ8 (GLOVE) ×18 IMPLANT
GOWN STRL REUS W/ TWL LRG LVL3 (GOWN DISPOSABLE) ×15 IMPLANT
GOWN STRL REUS W/ TWL XL LVL3 (GOWN DISPOSABLE) ×5 IMPLANT
GOWN STRL REUS W/TWL LRG LVL3 (GOWN DISPOSABLE) ×3
GOWN STRL REUS W/TWL XL LVL3 (GOWN DISPOSABLE) ×1
GRASPER SUT TROCAR 14GX15 (MISCELLANEOUS) ×6 IMPLANT
IRRIGATION STRYKERFLOW (MISCELLANEOUS) ×5 IMPLANT
IRRIGATOR STRYKERFLOW (MISCELLANEOUS) ×6
IV LACTATED RINGERS 1000ML (IV SOLUTION) ×6 IMPLANT
KIT RM TURNOVER CYSTO AR (KITS) ×6 IMPLANT
LABEL OR SOLS (LABEL) IMPLANT
MORCELLATOR XCISE  COR (MISCELLANEOUS) ×1
MORCELLATOR XCISE COR (MISCELLANEOUS) ×5 IMPLANT
NS IRRIG 500ML POUR BTL (IV SOLUTION) ×12 IMPLANT
PACK GYN LAPAROSCOPIC (MISCELLANEOUS) ×6 IMPLANT
PAD OB MATERNITY 4.3X12.25 (PERSONAL CARE ITEMS) ×6 IMPLANT
PAD PREP 24X41 OB/GYN DISP (PERSONAL CARE ITEMS) ×6 IMPLANT
SET CYSTO W/LG BORE CLAMP LF (SET/KITS/TRAYS/PACK) ×6 IMPLANT
SHEARS HARMONIC ACE PLUS 36CM (ENDOMECHANICALS) ×6 IMPLANT
SOLUTION ELECTROLUBE (MISCELLANEOUS) ×6 IMPLANT
SPONGE VERSALON 2X2 STRL (MISCELLANEOUS) ×2
SPONGE XRAY 4X4 16PLY STRL (MISCELLANEOUS) ×6 IMPLANT
STRIP CLOSURE SKIN 1/2X4 (GAUZE/BANDAGES/DRESSINGS) ×6 IMPLANT
SUT VIC AB 0 CT1 36 (SUTURE) ×24 IMPLANT
SUT VIC AB 2-0 UR6 27 (SUTURE) ×6 IMPLANT
SUT VIC AB 4-0 SH 27 (SUTURE) ×2
SUT VIC AB 4-0 SH 27XANBCTRL (SUTURE) ×10 IMPLANT
SYR 50ML LL SCALE MARK (SYRINGE) ×6 IMPLANT
SYRINGE 10CC LL (SYRINGE) ×6 IMPLANT
TROCAR ENDO BLADELESS 11MM (ENDOMECHANICALS) ×6 IMPLANT
TROCAR XCEL NON-BLD 5MMX100MML (ENDOMECHANICALS) ×6 IMPLANT
TROCAR XCEL UNIV SLVE 11M 100M (ENDOMECHANICALS) ×12 IMPLANT
TUBING INSUFFLATOR HI FLOW (MISCELLANEOUS) ×6 IMPLANT

## 2016-08-13 NOTE — OR Nursing (Signed)
Incentive spirometer given to pt, she has used with 2 prior surgeries.

## 2016-08-13 NOTE — Brief Op Note (Signed)
08/13/2016  2:52 PM  PATIENT:  Virginia Webb  41 y.o. female  PRE-OPERATIVE DIAGNOSIS:  Menorrhagia  Fibroids, significant Pelvic adhesive disease   POST-OPERATIVE DIAGNOSIS:  Menorrhagia, Fibroids,significant Pelvic adhesive disease  PROCEDURE:  Procedure(s): LAPAROSCOPIC SUPRACERVICAL HYSTERECTOMY (N/A) LAPAROSCOPIC UNILATERAL SALPINGECTOMY (Right) CYSTOSCOPY (N/A) LAPAROSCOPIC LYSIS OF ADHESIONS( significant > 75% of operating time )  SURGEON:  Surgeon(s) and Role:    * Trashawn Oquendo, Gwen Her, MD - Primary    * Benjaman Kindler, MD  PHYSICIAN ASSISTANT: Verdell Carmine , [place] Student   ASSISTANTS: none   ANESTHESIA:   general  EBL:  Total I/O In: 1800 [I.V.:1800] Out: 800 [Urine:600; Blood:200]  BLOOD ADMINISTERED:none  DRAINS: Urinary Catheter (Foley)   LOCAL MEDICATIONS USED:  MARCAINE     SPECIMEN:  Source of Specimen:  uterus  and right fallopian tube   DISPOSITION OF SPECIMEN:  Source of Specimen:  uterus and right fallopian tube   COUNTS:  YES  TOURNIQUET:  * No tourniquets in log *  DICTATION: .Other Dictation: Dictation Number verbal   PLAN OF CARE: Admit for overnight observation  PATIENT DISPOSITION:  PACU - hemodynamically stable.   Delay start of Pharmacological VTE agent (>24hrs) due to surgical blood loss or risk of bleeding: not applicable

## 2016-08-13 NOTE — Anesthesia Preprocedure Evaluation (Addendum)
Anesthesia Evaluation  Patient identified by MRN, date of birth, ID band Patient awake    Reviewed: Allergy & Precautions, NPO status , Patient's Chart, lab work & pertinent test results  Airway Mallampati: II  TM Distance: >3 FB     Dental   Pulmonary Current Smoker,    Pulmonary exam normal        Cardiovascular hypertension, Normal cardiovascular exam     Neuro/Psych PSYCHIATRIC DISORDERS Anxiety Depression Bipolar Disorder CVA, Residual Symptoms    GI/Hepatic Neg liver ROS, GERD  ,  Endo/Other  negative endocrine ROS  Renal/GU negative Renal ROS  Female GU complaint     Musculoskeletal negative musculoskeletal ROS (+)   Abdominal Normal abdominal exam  (+)   Peds negative pediatric ROS (+)  Hematology  (+) anemia ,   Anesthesia Other Findings Past Medical History: No date: Anxiety and depression No date: Bipolar affective disorder (HCC) No date: Fatigue No date: GERD (gastroesophageal reflux disease) No date: Hyperlipidemia No date: Hypertension No date: Iron deficiency anemia 06/06/2012: Iron deficiency anemia, unspecified No date: Lung nodules No date: Menorrhagia No date: Ovarian tumor No date: Stroke [facility])     Comment:  Lt side affected  Reproductive/Obstetrics                             Anesthesia Physical Anesthesia Plan  ASA: III  Anesthesia Plan: General   Post-op Pain Management:    Induction: Intravenous  PONV Risk Score and Plan: 3 and Ondansetron, Dexamethasone, Midazolam and Propofol infusion  Airway Management Planned: Oral ETT  Additional Equipment:   Intra-op Plan:   Post-operative Plan: Extubation in OR  Informed Consent: I have reviewed the patients History and Physical, chart, labs and discussed the procedure including the risks, benefits and alternatives for the proposed anesthesia with the patient or authorized representative who has indicated  his/her understanding and acceptance.   Dental advisory given  Plan Discussed with: CRNA and Surgeon  Anesthesia Plan Comments:         Anesthesia Quick Evaluation

## 2016-08-13 NOTE — Anesthesia Postprocedure Evaluation (Signed)
Anesthesia Post Note  Patient: Virginia Webb  Procedure(s) Performed: Procedure(s) (LRB): LAPAROSCOPIC SUPRACERVICAL HYSTERECTOMY (N/A) LAPAROSCOPIC UNILATERAL SALPINGECTOMY (Right) CYSTOSCOPY (N/A) LAPAROSCOPIC LYSIS OF ADHESIONS  Patient location during evaluation: PACU Anesthesia Type: General Level of consciousness: awake and alert and oriented Pain management: pain level controlled Vital Signs Assessment: post-procedure vital signs reviewed and stable Respiratory status: spontaneous breathing Cardiovascular status: blood pressure returned to baseline Anesthetic complications: no     Last Vitals:  Vitals:   08/13/16 1550 08/13/16 1555  BP:  119/81  Pulse: 68 (!) 53  Resp: 14 11  Temp:  36.6 C  SpO2: 100% 100%    Last Pain:  Vitals:   08/13/16 1550  TempSrc:   PainSc: 3                  Faron Whitelock

## 2016-08-13 NOTE — Progress Notes (Signed)
Pt ready for [facility] for menorrhagia and fibroids  Neg HCG , hct 30.9%  Npo  Ready for surgery  All questions answered

## 2016-08-13 NOTE — Progress Notes (Signed)
Patient ID: Virginia Webb, female   DOB: 10-18-1975, 41 y.o.   MRN: [id number] [facility] , ++ LOA , right salpinpingectomy  PAin in good control  VSS Urine output good  Anticipate d/c tomorrow

## 2016-08-13 NOTE — Transfer of Care (Signed)
Immediate Anesthesia Transfer of Care Note  Patient: Virginia Webb  Procedure(s) Performed: Procedure(s): LAPAROSCOPIC SUPRACERVICAL HYSTERECTOMY (N/A) LAPAROSCOPIC UNILATERAL SALPINGECTOMY (Right) CYSTOSCOPY (N/A) LAPAROSCOPIC LYSIS OF ADHESIONS  Patient Location: PACU  Anesthesia Type:General  Level of Consciousness: sedated  Airway & Oxygen Therapy: Patient Spontanous Breathing and Patient connected to face mask oxygen  Post-op Assessment: Report given to RN and Post -op Vital signs reviewed and stable  Post vital signs: Reviewed  Last Vitals:  Vitals:   08/13/16 0854 08/13/16 1513  BP: (!) 147/102 124/84  Pulse: 60 80  Resp: 16 15  Temp: 36.7 C (!) 36.2 C  SpO2: 100% 100%    Last Pain:  Vitals:   08/13/16 0854  TempSrc: Oral         Complications: No apparent anesthesia complications

## 2016-08-13 NOTE — Anesthesia Post-op Follow-up Note (Signed)
Anesthesia QCDR form completed.        

## 2016-08-13 NOTE — Anesthesia Procedure Notes (Signed)
Procedure Name: Intubation Performed by: Rolla Plate Pre-anesthesia Checklist: Patient identified, Patient being monitored, Timeout performed, Emergency Drugs available and Suction available Patient Re-evaluated:Patient Re-evaluated prior to induction Oxygen Delivery Method: Circle system utilized Preoxygenation: Pre-oxygenation with 100% oxygen Induction Type: IV induction Ventilation: Mask ventilation without difficulty Laryngoscope Size: Miller and 2 Grade View: Grade I Tube type: Oral Tube size: 7.0 mm Number of attempts: 1 Airway Equipment and Method: Stylet Placement Confirmation: ETT inserted through vocal cords under direct vision,  positive ETCO2 and breath sounds checked- equal and bilateral Secured at: 22 cm Tube secured with: Tape Dental Injury: Teeth and Oropharynx as per pre-operative assessment

## 2016-08-14 DIAGNOSIS — N920 Excessive and frequent menstruation with regular cycle: Secondary | ICD-10-CM | POA: Diagnosis not present

## 2016-08-14 LAB — BASIC METABOLIC PANEL
ANION GAP: 5 (ref 5–15)
BUN: 10 mg/dL (ref 6–20)
CHLORIDE: 106 mmol/L (ref 101–111)
CO2: 28 mmol/L (ref 22–32)
Calcium: 8.4 mg/dL — ABNORMAL LOW (ref 8.9–10.3)
Creatinine, Ser: 0.84 mg/dL (ref 0.44–1.00)
GFR calc non Af Amer: >60 mL/min (ref >60)
Glucose, Bld: 108 mg/dL — ABNORMAL HIGH (ref 65–99)
Potassium: 3.7 mmol/L (ref 3.5–5.1)
Sodium: 139 mmol/L (ref 135–145)

## 2016-08-14 LAB — CBC
HEMATOCRIT: 26.1 % — AB (ref 35.0–47.0)
HEMOGLOBIN: 8.4 g/dL — AB (ref 12.0–16.0)
MCH: 21.3 pg — ABNORMAL LOW (ref 26.0–34.0)
MCHC: 32.3 g/dL (ref 32.0–36.0)
MCV: 65.9 fL — ABNORMAL LOW (ref 80.0–100.0)
Platelets: 214 10*3/uL (ref 150–440)
RBC: 3.97 MIL/uL (ref 3.80–5.20)
RDW: 15.7 % — ABNORMAL HIGH (ref 11.5–14.5)
WBC: 7.5 10*3/uL (ref 3.6–11.0)

## 2016-08-14 MED ORDER — IBUPROFEN 50 MG PO CHEW: 400.0000 mg | CHEWABLE_TABLET | Freq: Four times a day (QID) | ORAL | 2 refills | Status: AC | PRN

## 2016-08-14 MED ORDER — HYDROCODONE-ACETAMINOPHEN 7.5-325 MG/15ML PO SOLN: 15.0000 mL | ORAL | 0 refills | Status: DC | PRN

## 2016-08-14 MED ORDER — SIMETHICONE 80 MG PO CHEW: 80.0000 mg | CHEWABLE_TABLET | Freq: Four times a day (QID) | ORAL | 0 refills | Status: DC | PRN

## 2016-08-14 MED ORDER — ONDANSETRON HCL 4 MG PO TABS: 4.0000 mg | ORAL_TABLET | Freq: Four times a day (QID) | ORAL | 0 refills | Status: DC | PRN

## 2016-08-14 NOTE — Discharge Summary (Signed)
Physician Discharge Summary  Patient ID: Virginia Webb MRN: [id number] DOB/AGE: [birth year] 41 y.o.  Admit date: 08/13/2016 Discharge date: 08/14/2016  Admission Diagnoses:menorrhagia  Discharge Diagnoses:  Active Problems:   Post-operative state PAD   Discharged Condition: good  Hospital Course: pt underwent an uncomplicated  LSH , LOA  Right salpingectomy   Consults: None  Significant Diagnostic Studies: labs:  Results for orders placed or performed during the hospital encounter of 08/13/16 (from the past 24 hour(s))  ABO/Rh     Status: None   Collection Time: 08/13/16  9:36 AM  Result Value Ref Range   ABO/RH(D) AB POS   CBC     Status: Abnormal   Collection Time: 08/14/16  4:45 AM  Result Value Ref Range   WBC 7.5 3.6 - 11.0 K/uL   RBC 3.97 3.80 - 5.20 MIL/uL   Hemoglobin 8.4 (L) 12.0 - 16.0 g/dL   HCT 26.1 (L) 35.0 - 47.0 %   MCV 65.9 (L) 80.0 - 100.0 fL   MCH 21.3 (L) 26.0 - 34.0 pg   MCHC 32.3 32.0 - 36.0 g/dL   RDW 15.7 (H) 11.5 - 14.5 %   Platelets 214 150 - 440 K/uL  Basic metabolic panel     Status: Abnormal   Collection Time: 08/14/16  4:45 AM  Result Value Ref Range   Sodium 139 135 - 145 mmol/L   Potassium 3.7 3.5 - 5.1 mmol/L   Chloride 106 101 - 111 mmol/L   CO2 28 22 - 32 mmol/L   Glucose, Bld 108 (H) 65 - 99 mg/dL   BUN 10 6 - 20 mg/dL   Creatinine, Ser 0.84 0.44 - 1.00 mg/dL   Calcium 8.4 (L) 8.9 - 10.3 mg/dL   GFR calc non Af Amer >60 >60 mL/min   GFR calc Af Amer >60 >60 mL/min   Anion gap 5 5 - 15    Treatments: surgery: as above   Discharge Exam: Blood pressure 133/73, pulse (!) 47, temperature 98.6 F (37 C), temperature source Oral, resp. rate 18, height 5\' 7"  (1.702 m), weight 85.7 kg (189 lb), last menstrual period 07/24/2016, SpO2 100 %. General appearance: alert and cooperative Resp: clear to auscultation bilaterally Cardio: regular rate and rhythm, S1, S2 normal, no murmur, click, rub or  gallop Incision/Wound:c/d/I  Disposition: 01-Home or Self Care  Discharge Instructions    Call MD for:  difficulty breathing, headache or visual disturbances    Complete by:  As directed    Call MD for:  extreme fatigue    Complete by:  As directed    Call MD for:  hives    Complete by:  As directed    Call MD for:  persistant dizziness or light-headedness    Complete by:  As directed    Call MD for:  persistant nausea and vomiting    Complete by:  As directed    Call MD for:  redness, tenderness, or signs of infection (pain, swelling, redness, odor or green/yellow discharge around incision site)    Complete by:  As directed    Call MD for:  severe uncontrolled pain    Complete by:  As directed    Call MD for:  temperature >100.4    Complete by:  As directed    Diet - low sodium heart healthy    Complete by:  As directed    Increase activity slowly    Complete by:  As directed      Allergies  as of 08/14/2016   No Known Allergies     Medication List    TAKE these medications   divalproex 125 MG capsule Commonly known as:  DEPAKOTE SPRINKLE Take 125 mg by mouth 2 (two) times daily.   ferrous sulfate 220 (44 Fe) MG/5ML solution Take 325 mg by mouth daily.   HYDROcodone-acetaminophen 7.5-325 mg/15 ml solution Commonly known as:  HYCET Take 15 mLs by mouth every 4 (four) hours as needed for moderate pain.   ibuprofen 50 MG chewable tablet Commonly known as:  ADVIL,MOTRIN Chew 8 tablets (400 mg total) by mouth every 6 (six) hours as needed for pain or fever.   MULTIVITAMIN ADULT Chew Chew by mouth daily.   ondansetron 4 MG tablet Commonly known as:  ZOFRAN Take 1 tablet (4 mg total) by mouth every 6 (six) hours as needed for nausea.   simethicone 80 MG chewable tablet Commonly known as:  MYLICON Chew 1 tablet (80 mg total) by mouth 4 (four) times daily as needed for flatulence.      Follow-up Information    Sydni Elizarraraz, Gwen Her, MD Follow up in 2 week(s).    Specialty:  Obstetrics and Gynecology Why:  post op  Contact information: [address] [place] [address] [phone]           Signed: Gwen Her Virginia Webb 08/14/2016, 9:19 AM

## 2016-08-14 NOTE — Op Note (Signed)
Virginia Webb, Virginia Webb NO.:  [id number]  MEDICAL RECORD NO.:  [id number]  LOCATION:                                 FACILITY:  PHYSICIAN:  Laverta Baltimore, MD     DATE OF BIRTH:  DATE OF PROCEDURE:  08/13/2016 DATE OF DISCHARGE:                              OPERATIVE REPORT   PREOPERATIVE DIAGNOSIS: 1. Menorrhagia. 2. Fibroid uterus.  POSTOPERATIVE DIAGNOSIS: 1. Significant pelvic adhesive disease. 2. Menorrhagia. 3. Fibroids.  PROCEDURE PERFORMED: 1. Laparoscopic pelvic adhesiolysis incorporating greater than 75% of     total operating time. 2. Supracervical hysterectomy. 3. Right salpingectomy.  SURGEON:  Laverta Baltimore, MD  ANESTHESIA:  General endotracheal anesthesia.  SURGEON:  Laverta Baltimore, MD  ASSISTANTS: 1. Benjaman Kindler, MD. [place], PA student.  INDICATION:  A 41 year old female with a long history of heavy bleeding with iron-deficiency anemia consistent with menorrhagia.  The patient was noted to have a 6 cm posterior fibroid as well.  DESCRIPTION OF PROCEDURE:  After adequate general endotracheal anesthesia, the patient was placed in dorsal supine position, legs in the [place] stirrups.  The patient was prepped and draped in normal sterile fashion.  She did receive 2 g of IV cefoxitin prior to commencement of the case for surgical prophylaxis.  Time-out was performed.  A 12 mm infraumbilical incision was made after injecting with 0.5% Marcaine.  The laparoscope was advanced into the abdominal cavity under direct visualization with the Optiview cannula.  Upon gaining entrance into the abdominal cavity, the patient's abdomen was insufflated.  There were marked adhesions noted from the uterus to the anterior abdominal wall and its full length, several omental adhesions coalescing with the uterus.  Adnexal adhesions noted as well with the left greater than the right.  Second port site was placed in left  lower quadrant.  10 mm port was advanced under direct visualization 3 cm medial to the left anterior iliac spine.  A third port site was placed to the right lower quadrant again 3 cm medial to the right anterior iliac spine under direct visualization.  The trocar was advanced for the next 2-1/2 hours which incorporated approximately 75% of total operating time.  Harmonic Scalpel was used to meticulously dissect the uterus from all significant scar tissue.  Traction, counter traction, blunt traction, blunt dissection, and sharp dissection were used to complete the process.  Once the uterus was freed from adhesions, then the left utero-ovarian ligament was cauterized and transected, and the broad ligament was entered, and the left uterine artery ultimately was skeletonized and cauterized with the Kleppinger cautery and transected. Bladder flap was created again with sharp and blunt dissection.  Similar procedure was repeated on the patient's right side after visualizing the path of the ureter and normal peristaltic activity.  The utero-ovarian ligament was identified, clamped, transected, and the broad ligament was then sequentially opened, and the right uterine artery was identified, skeletonized, cauterized, and transected.  At the level of the uterosacral ligaments, the cervix was then amputated and the endocervical canal was then cauterized with the Kleppinger's.  Good hemostasis was noted.  Morcellator was then brought up to the operative field and advanced  under direct visualization and the uterus with the large posterior fibroid was morcellated and removed from the abdominal cavity.  The right fallopian tube, given that it was somewhat free, was dissected with the Harmonic scalpel, removed.  The left fallopian tube was too densely adherent to the sidewall and, therefore, was not removed.  The patient's abdomen was irrigated, and the 3 port sites were closed with a fascial layer with  2-0 Vicryl suture and all skin was closed with interrupted 4-0 Vicryl suture.  Given the amount of dissection at the time of the procedure, surgeon felt it was prudent to perform a cystoscopy which was completed.  0.5 mL of fluorescein was injected intravenously, and the ureteral openings were identified and both had normal pluming of urine with fluorescein evident.  Bladder was drained.  Foley was replaced.  There were no complications.  The patient tolerated the procedure well.  ESTIMATED BLOOD LOSS:  200 mL.  INTRAOPERATIVE FLUIDS:  1800 mL.  URINE OUTPUT:  600 mL.  The patient was taken to recovery room in good condition.    ______________________________ Laverta Baltimore, MD   ______________________________ Laverta Baltimore, MD    TS/MEDQ  D:  08/13/2016  T:  08/13/2016  Job:  [id number]

## 2016-08-15 LAB — SURGICAL PATHOLOGY

## 2016-08-22 ENCOUNTER — Emergency Department: Payer: Federal, State, Local not specified - PPO

## 2016-08-22 ENCOUNTER — Encounter: Payer: Self-pay | Admitting: Emergency Medicine

## 2016-08-22 ENCOUNTER — Emergency Department
Admission: EM | Admit: 2016-08-22 | Discharge: 2016-08-22 | Disposition: A | Discharge status: Home / Self Care | Payer: Federal, State, Local not specified - PPO | Attending: Emergency Medicine | Admitting: Emergency Medicine

## 2016-08-22 DIAGNOSIS — I10 Essential (primary) hypertension: Secondary | ICD-10-CM | POA: Diagnosis not present

## 2016-08-22 DIAGNOSIS — R112 Nausea with vomiting, unspecified: Secondary | ICD-10-CM | POA: Diagnosis present

## 2016-08-22 DIAGNOSIS — G8918 Other acute postprocedural pain: Secondary | ICD-10-CM | POA: Diagnosis not present

## 2016-08-22 DIAGNOSIS — K529 Noninfective gastroenteritis and colitis, unspecified: Secondary | ICD-10-CM | POA: Diagnosis not present

## 2016-08-22 LAB — URINALYSIS, COMPLETE (UACMP) WITH MICROSCOPIC
Bacteria, UA: NONE SEEN
Bilirubin Urine: NEGATIVE
Glucose, UA: NEGATIVE mg/dL
KETONES UR: NEGATIVE mg/dL
Leukocytes, UA: NEGATIVE
Nitrite: NEGATIVE
PH: 5 (ref 5.0–8.0)
Protein, ur: NEGATIVE mg/dL
SPECIFIC GRAVITY, URINE: 1.021 (ref 1.005–1.030)

## 2016-08-22 LAB — GASTROINTESTINAL PANEL BY PCR, STOOL (REPLACES STOOL CULTURE)
ASTROVIRUS: NOT DETECTED
Adenovirus F40/41: NOT DETECTED
Campylobacter species: NOT DETECTED
Cryptosporidium: NOT DETECTED
Cyclospora cayetanensis: NOT DETECTED
ENTAMOEBA HISTOLYTICA: NOT DETECTED
ENTEROAGGREGATIVE E COLI (EAEC): NOT DETECTED
ENTEROTOXIGENIC E COLI (ETEC): NOT DETECTED
Enteropathogenic E coli (EPEC): NOT DETECTED
Giardia lamblia: NOT DETECTED
NOROVIRUS GI/GII: NOT DETECTED
Plesimonas shigelloides: NOT DETECTED
Rotavirus A: NOT DETECTED
SAPOVIRUS (I, II, IV, AND V): NOT DETECTED
SHIGA LIKE TOXIN PRODUCING E COLI (STEC): NOT DETECTED
Salmonella species: NOT DETECTED
Shigella/Enteroinvasive E coli (EIEC): NOT DETECTED
VIBRIO CHOLERAE: NOT DETECTED
Vibrio species: NOT DETECTED
Yersinia enterocolitica: NOT DETECTED

## 2016-08-22 LAB — COMPREHENSIVE METABOLIC PANEL
ALK PHOS: 46 U/L (ref 38–126)
ALT: 9 U/L — AB (ref 14–54)
AST: 12 U/L — AB (ref 15–41)
Albumin: 3.7 g/dL (ref 3.5–5.0)
Anion gap: 8 (ref 5–15)
BUN: 13 mg/dL (ref 6–20)
CALCIUM: 9 mg/dL (ref 8.9–10.3)
CHLORIDE: 104 mmol/L (ref 101–111)
CO2: 30 mmol/L (ref 22–32)
CREATININE: 0.94 mg/dL (ref 0.44–1.00)
GFR calc Af Amer: >60 mL/min (ref >60)
GFR calc non Af Amer: >60 mL/min (ref >60)
Glucose, Bld: 99 mg/dL (ref 65–99)
Potassium: 3.8 mmol/L (ref 3.5–5.1)
SODIUM: 142 mmol/L (ref 135–145)
Total Bilirubin: 0.6 mg/dL (ref 0.3–1.2)
Total Protein: 7.3 g/dL (ref 6.5–8.1)

## 2016-08-22 LAB — VALPROIC ACID LEVEL: Valproic Acid Lvl: <10 ug/mL — ABNORMAL LOW (ref 50.0–100.0)

## 2016-08-22 LAB — CBC
HCT: 33.3 % — ABNORMAL LOW (ref 35.0–47.0)
Hemoglobin: 10.7 g/dL — ABNORMAL LOW (ref 12.0–16.0)
MCH: 21.3 pg — AB (ref 26.0–34.0)
MCHC: 32.2 g/dL (ref 32.0–36.0)
MCV: 66.1 fL — AB (ref 80.0–100.0)
PLATELETS: 369 10*3/uL (ref 150–440)
RBC: 5.04 MIL/uL (ref 3.80–5.20)
RDW: 16.6 % — AB (ref 11.5–14.5)
WBC: 6.2 10*3/uL (ref 3.6–11.0)

## 2016-08-22 LAB — C DIFFICILE QUICK SCREEN W PCR REFLEX
C DIFFICILE (CDIFF) INTERP: NOT DETECTED
C DIFFICILE (CDIFF) TOXIN: NEGATIVE
C DIFFICLE (CDIFF) ANTIGEN: NEGATIVE

## 2016-08-22 LAB — LIPASE, BLOOD: LIPASE: 25 U/L (ref 11–51)

## 2016-08-22 MED ORDER — IOPAMIDOL (ISOVUE-300) INJECTION 61%
100.0000 mL | Freq: Once | INTRAVENOUS | Status: AC | PRN
  Administered 2016-08-22: 100 mL via INTRAVENOUS

## 2016-08-22 MED ORDER — IOPAMIDOL (ISOVUE-M 300) INJECTION 61%: 15.0000 mL | Freq: Once | INTRAMUSCULAR | Status: DC | PRN

## 2016-08-22 MED ORDER — SODIUM CHLORIDE 0.9 % IV SOLN
Freq: Once | INTRAVENOUS | Status: AC
  Administered 2016-08-22: 09:00:00 via INTRAVENOUS

## 2016-08-22 MED ORDER — ONDANSETRON HCL 4 MG/2ML IJ SOLN
4.0000 mg | Freq: Once | INTRAMUSCULAR | Status: AC
  Administered 2016-08-22: 4 mg via INTRAVENOUS
  Filled 2016-08-22: qty 2

## 2016-08-22 MED ORDER — PROMETHAZINE HCL 25 MG RE SUPP: 25.0000 mg | Freq: Four times a day (QID) | RECTAL | 1 refills | Status: DC | PRN

## 2016-08-22 MED ORDER — MORPHINE SULFATE (PF) 4 MG/ML IV SOLN
INTRAVENOUS | Status: AC
  Filled 2016-08-22: qty 1

## 2016-08-22 MED ORDER — PROMETHAZINE HCL 6.25 MG/5ML PO SYRP
25.0000 mg | ORAL_SOLUTION | Freq: Four times a day (QID) | ORAL | 1 refills | Status: DC | PRN
Start: 2016-08-22 — End: 2018-10-11

## 2016-08-22 MED ORDER — ONDANSETRON HCL 4 MG/2ML IJ SOLN: 4.0000 mg | Freq: Once | INTRAMUSCULAR | Status: DC | PRN

## 2016-08-22 MED ORDER — LOPERAMIDE HCL 2 MG PO TABS: 2.0000 mg | ORAL_TABLET | Freq: Four times a day (QID) | ORAL | 0 refills | Status: DC | PRN

## 2016-08-22 MED ORDER — MORPHINE SULFATE (PF) 4 MG/ML IV SOLN
4.0000 mg | Freq: Once | INTRAVENOUS | Status: AC
  Administered 2016-08-22: 4 mg via INTRAVENOUS
  Filled 2016-08-22: qty 1

## 2016-08-22 MED ORDER — OXYCODONE-ACETAMINOPHEN 5-325 MG PO TABS: 1.0000 | ORAL_TABLET | Freq: Four times a day (QID) | ORAL | 0 refills | Status: DC | PRN

## 2016-08-22 MED ORDER — IOPAMIDOL (ISOVUE-300) INJECTION 61%: 15.0000 mL | INTRAVENOUS | Status: DC

## 2016-08-22 MED ORDER — ONDANSETRON 4 MG PO TBDP: 4.0000 mg | ORAL_TABLET | Freq: Three times a day (TID) | ORAL | 0 refills | Status: DC | PRN

## 2016-08-22 MED ORDER — MORPHINE SULFATE (PF) 4 MG/ML IV SOLN
4.0000 mg | Freq: Once | INTRAVENOUS | Status: AC
  Administered 2016-08-22: 4 mg via INTRAVENOUS

## 2016-08-22 NOTE — ED Provider Notes (Addendum)
[facility] Emergency Department Provider Note       Time seen: ----------------------------------------- 8:03 AM on 08/22/2016 -----------------------------------------     I have reviewed the triage vital signs and the nursing notes.   HISTORY   Chief Complaint Emesis    HPI Virginia Webb is a 41 y.o. female who presents to the ED for mid abdominal pain with nausea and vomiting since Sunday. Patient states she had a hysterectomy last week and has had increasing pain and vomiting since. Patient states she's been moving her bowels well since the surgery. She has had diarrhea associated with nausea and vomiting. She complains of pain 9 out of 10. In the upper abdomen. Nothing makes it better.   Past Medical History:  Diagnosis Date  . Anxiety and depression   . Bipolar affective disorder ([place])   . Fatigue   . GERD (gastroesophageal reflux disease)   . Hyperlipidemia   . Hypertension   . Iron deficiency anemia   . Iron deficiency anemia, unspecified 06/06/2012  . Lung nodules   . Menorrhagia   . Ovarian tumor   . 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 Post-operative state 08/13/2016  . Menorrhagia with regular cycle 03/03/2015  . Thalassemia, beta ([place]) 06/11/2013  . Tobacco abuse 06/11/2013  . Morbid obesity ([place]) 06/19/2012  . Iron deficiency anemia, unspecified 06/06/2012  . Fatigue   . Anemia   . Anxiety and depression     Past Surgical History:  Procedure Laterality Date  . CESAREAN SECTION  01/31/1995  . CESAREAN SECTION  10/22/2000  . CYSTOSCOPY N/A 08/13/2016   Procedure: CYSTOSCOPY;  Surgeon: Schermerhorn, Gwen Her, MD;  Location: [facility] ORS;  Service: Gynecology;  Laterality: N/A;  . Lap Band    . LAPAROSCOPIC LYSIS OF ADHESIONS  08/13/2016   Procedure: LAPAROSCOPIC LYSIS OF ADHESIONS;  Surgeon: Schermerhorn, Gwen Her, MD;  Location: [facility] ORS;  Service: Gynecology;;  .  LAPAROSCOPIC SUPRACERVICAL HYSTERECTOMY N/A 08/13/2016   Procedure: LAPAROSCOPIC SUPRACERVICAL HYSTERECTOMY;  Surgeon: Schermerhorn, Gwen Her, MD;  Location: [facility] ORS;  Service: Gynecology;  Laterality: N/A;  . LAPAROSCOPIC UNILATERAL SALPINGECTOMY Right 08/13/2016   Procedure: LAPAROSCOPIC UNILATERAL SALPINGECTOMY;  Surgeon: Schermerhorn, Gwen Her, MD;  Location: [facility] ORS;  Service: Gynecology;  Laterality: Right;  . TUBAL LIGATION      Allergies Patient has no known allergies.  Social History Social History  Substance Use Topics  . Smoking status: Current Every Day Smoker    Packs/day: 0.25    Years: 10.00  . Smokeless tobacco: Never Used  . Alcohol use No    Review of Systems Constitutional: Negative for fever. Cardiovascular: Negative for chest pain. Respiratory: Negative for shortness of breath. Gastrointestinal: Positive for abdominal pain, vomiting and diarrhea Genitourinary: Negative for dysuria. Musculoskeletal: Negative for back pain. Skin: Negative for rash. Neurological: Negative for headaches, focal weakness or numbness.  All systems negative/normal/unremarkable except as stated in the HPI  ____________________________________________   PHYSICAL EXAM:  VITAL SIGNS: ED Triage Vitals  Enc Vitals Group     BP 08/22/16 0801 (!) 139/100     Pulse Rate 08/22/16 0801 66     Resp 08/22/16 0801 20     Temp 08/22/16 0801 98.8 F (37.1 C)     Temp Source 08/22/16 0801 Oral     SpO2 08/22/16 0801 100 %     Weight 08/22/16 0753 189 lb (85.7 kg)  Height 08/22/16 0753 5\' 7"  (1.702 m)     Head Circumference --      Peak Flow --      Pain Score 08/22/16 0752 9     Pain Loc --      Pain Edu? --      Excl. in [place]? --     Constitutional: Alert and oriented. Moderate distress Eyes: Conjunctivae are normal. Normal extraocular movements. ENT   Head: Normocephalic and atraumatic.   Nose: No congestion/rhinnorhea.   Mouth/Throat: Mucous membranes are  moist.   Neck: No stridor. Cardiovascular: Normal rate, regular rhythm. No murmurs, rubs, or gallops. Respiratory: Normal respiratory effort without tachypnea nor retractions. Breath sounds are clear and equal bilaterally. No wheezes/rales/rhonchi. Gastrointestinal: Diffuse tenderness, hypoactive to absent bowel sounds Musculoskeletal: Nontender with normal range of motion in extremities. No lower extremity tenderness nor edema. Neurologic:  Normal speech and language. No gross focal neurologic deficits are appreciated.  Skin:  Skin is warm, dry and intact. No rash noted. Psychiatric: Mood and affect are normal. Speech and behavior are normal.  ____________________________________________  ED COURSE:  Pertinent labs & imaging results that were available during my care of the patient were reviewed by me and considered in my medical decision making (see chart for details). Patient presents for abdominal pain, we will assess with labs and imaging as indicated.   Procedures ____________________________________________   LABS (pertinent positives/negatives)  Labs Reviewed  COMPREHENSIVE METABOLIC PANEL - Abnormal; Notable for the following:       Result Value   AST 12 (*)    ALT 9 (*)    All other components within normal limits  CBC - Abnormal; Notable for the following:    Hemoglobin 10.7 (*)    HCT 33.3 (*)    MCV 66.1 (*)    MCH 21.3 (*)    RDW 16.6 (*)    All other components within normal limits  URINALYSIS, COMPLETE (UACMP) WITH MICROSCOPIC - Abnormal; Notable for the following:    Color, Urine YELLOW (*)    APPearance HAZY (*)    Hgb urine dipstick MODERATE (*)    Squamous Epithelial / LPF 6-30 (*)    All other components within normal limits  VALPROIC ACID LEVEL - Abnormal; Notable for the following:    Valproic Acid Lvl <10 (*)    All other components within normal limits  C DIFFICILE QUICK SCREEN W PCR REFLEX  GASTROINTESTINAL PANEL BY PCR, STOOL (REPLACES STOOL  CULTURE)  LIPASE, BLOOD    RADIOLOGY Images were viewed by me  CT of abdomen and pelvis with contrast IMPRESSION: Moderate wall thickening involving distal small bowel loops, consistent with enteritis.  Mild ascites and diffuse mesenteric edema. No abscess identified.  Recent postop changes from supracervical hysterectomy. 3.8 cm fluid collection with hematocrit level in the left adnexa, which could represent a postop hematoma or hemorrhagic ovarian cyst.  Colonic diverticulosis, without radiographic evidence of diverticulitis. ____________________________________________  FINAL ASSESSMENT AND PLAN  Abdominal pain, vomiting and diarrhea  Plan: Patient's labs and imaging were dictated above. Patient had presented for symptoms of gastroenteritis and has had a relatively unremarkable workup. She does not have any lower abdominal tenderness and I do not think CT findings reflect any unusual postoperative event from a hysterectomy. She currently is improved after pain medicine and antiemetics. She is stable for outpatient follow-up.   Earleen Newport, MD   Note: This note was generated in part or whole with voice recognition software. Voice recognition  is usually quite accurate but there are transcription errors that can and very often do occur. I apologize for any typographical errors that were not detected and corrected.     Earleen Newport, MD 08/22/16 1021    Earleen Newport, MD 08/22/16 [phone]

## 2016-08-22 NOTE — ED Triage Notes (Signed)
Pt here from home with c/o mid abd pain with nausea and vomiting since Sunday. Pt states she had a hysterectomy last week and has had increasing pain and vomiting since. Tearful in triage.

## 2016-08-31 ENCOUNTER — Inpatient Hospital Stay: Payer: Federal, State, Local not specified - PPO | Admitting: Oncology

## 2017-05-13 ENCOUNTER — Emergency Department: Payer: Federal, State, Local not specified - PPO

## 2017-05-13 ENCOUNTER — Other Ambulatory Visit: Payer: Self-pay

## 2017-05-13 ENCOUNTER — Encounter: Payer: Self-pay | Admitting: Emergency Medicine

## 2017-05-13 ENCOUNTER — Emergency Department
Admission: EM | Admit: 2017-05-13 | Discharge: 2017-05-13 | Disposition: A | Discharge status: Home / Self Care | Payer: Federal, State, Local not specified - PPO | Attending: Emergency Medicine | Admitting: Emergency Medicine

## 2017-05-13 DIAGNOSIS — S8002XA Contusion of left knee, initial encounter: Secondary | ICD-10-CM | POA: Diagnosis not present

## 2017-05-13 DIAGNOSIS — F17200 Nicotine dependence, unspecified, uncomplicated: Secondary | ICD-10-CM | POA: Insufficient documentation

## 2017-05-13 DIAGNOSIS — Y92410 Unspecified street and highway as the place of occurrence of the external cause: Secondary | ICD-10-CM | POA: Diagnosis not present

## 2017-05-13 DIAGNOSIS — R51 Headache: Secondary | ICD-10-CM | POA: Insufficient documentation

## 2017-05-13 DIAGNOSIS — S161XXA Strain of muscle, fascia and tendon at neck level, initial encounter: Secondary | ICD-10-CM | POA: Diagnosis not present

## 2017-05-13 DIAGNOSIS — Y9389 Activity, other specified: Secondary | ICD-10-CM | POA: Diagnosis not present

## 2017-05-13 DIAGNOSIS — Z79899 Other long term (current) drug therapy: Secondary | ICD-10-CM | POA: Insufficient documentation

## 2017-05-13 DIAGNOSIS — I10 Essential (primary) hypertension: Secondary | ICD-10-CM | POA: Diagnosis not present

## 2017-05-13 DIAGNOSIS — Y999 Unspecified external cause status: Secondary | ICD-10-CM | POA: Diagnosis not present

## 2017-05-13 DIAGNOSIS — S63501A Unspecified sprain of right wrist, initial encounter: Secondary | ICD-10-CM | POA: Diagnosis not present

## 2017-05-13 DIAGNOSIS — S199XXA Unspecified injury of neck, initial encounter: Secondary | ICD-10-CM | POA: Diagnosis present

## 2017-05-13 MED ORDER — IBUPROFEN 100 MG/5ML PO SUSP
600.0000 mg | Freq: Once | ORAL | Status: AC
  Administered 2017-05-13: 600 mg via ORAL

## 2017-05-13 MED ORDER — IBUPROFEN 100 MG/5ML PO SUSP
ORAL | Status: AC
  Filled 2017-05-13: qty 30
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
## 2017-05-13 NOTE — ED Notes (Signed)
Pt discharged home after verbalizing understanding of discharge instructions; nad noted. 

## 2017-05-13 NOTE — ED Notes (Addendum)
Pt also has right wrist pain and left knee pain
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
## 2017-05-13 NOTE — ED Notes (Signed)
Pt has had partial hysterectomy

## 2018-10-08 ENCOUNTER — Other Ambulatory Visit: Payer: Self-pay | Admitting: *Deleted

## 2018-10-08 DIAGNOSIS — Z20822 Contact with and (suspected) exposure to covid-19: Secondary | ICD-10-CM

## 2018-10-10 LAB — NOVEL CORONAVIRUS, NAA: SARS-CoV-2, NAA: NOT DETECTED

## 2018-10-11 ENCOUNTER — Ambulatory Visit (INDEPENDENT_AMBULATORY_CARE_PROVIDER_SITE_OTHER)
Admission: RE | Admit: 2018-10-11 | Discharge: 2018-10-11 | Disposition: A | Discharge status: Home / Self Care | Payer: Federal, State, Local not specified - PPO | Source: Ambulatory Visit

## 2018-10-11 ENCOUNTER — Inpatient Hospital Stay
Admission: RE | Admit: 2018-10-11 | Discharge: 2018-10-11 | Disposition: A | Discharge status: Home / Self Care | Payer: Federal, State, Local not specified - PPO | Source: Ambulatory Visit

## 2018-10-11 DIAGNOSIS — L03011 Cellulitis of right finger: Secondary | ICD-10-CM

## 2018-10-11 MED ORDER — CEPHALEXIN 500 MG PO CAPS: 500.0000 mg | ORAL_CAPSULE | Freq: Four times a day (QID) | ORAL | 0 refills | Status: DC

## 2018-10-11 NOTE — ED Provider Notes (Signed)
Virtual Visit via Video Note:  [name]  initiated request for Telemedicine visit with [facility] team. I connected with [name]  on 10/11/2018 at 12:13 PM  for a synchronized telemedicine visit using a video enabled HIPPA compliant telemedicine application. I verified that I am speaking with [name]  using two identifiers. Orvan July, NP  was physically located in a [facility] Urgent care site and CHAUNTELL CREDIT was located at a different location.   The limitations of evaluation and management by telemedicine as well as the availability of in-person appointments were discussed. Patient was informed that she  may incur a bill ( including co-pay) for this virtual visit encounter. Lekeya C Macpherson  expressed understanding and gave verbal consent to proceed with virtual visit.     History of Present Illness:Virginia Webb  is a 43 y.o. female presents with swelling to nailbed of right middle finger.  This is been present worsening over the past couple days.  She has been doing warm soaks without much relief.  Some area of discoloration.  She did hit the nailbed prior to this happening.  Denies any trouble with range of motion or bruising.  No deformities.  No fever  Past Medical History:  Diagnosis Date  . Anxiety and depression   . Bipolar affective disorder ([place])   . Fatigue   . GERD (gastroesophageal reflux disease)   . Hyperlipidemia   . Hypertension   . Iron deficiency anemia   . Iron deficiency anemia, unspecified 06/06/2012  . Lung nodules   . Menorrhagia   . Ovarian tumor   . Stroke ([place])    Lt side affected    No Known Allergies      Observations/Objective:VITALS: Per patient if applicable, see vitals. GENERAL: Alert, appears well and in no acute distress. HEENT: Atraumatic, conjunctiva clear, no obvious abnormalities on inspection of external nose and ears. NECK: Normal movements of the head and neck. CARDIOPULMONARY: No increased  WOB. Speaking in clear sentences. I:E ratio WNL.  MS: Moves all visible extremities without noticeable abnormality. PSYCH: Pleasant and cooperative, well-groomed. Speech normal rate and rhythm. Affect is appropriate. Insight and judgement are appropriate. Attention is focused, linear, and appropriate.  NEURO: CN grossly intact. Oriented as arrived to appointment on time with no prompting. Moves both UE equally.  SKIN: Swelling and discoloration to nail bed of right middle finger.  Mild erythema and swelling.    Assessment and Plan: Exam and symptoms consistent with paronychia.  If patient was in person would I&D but will have her do warm compresses at home and massage the area to promote drainage.  Keflex for infection   Follow Up Instructions: Follow-up in person for any worsening symptoms    I discussed the assessment and treatment plan with the patient. The patient was provided an opportunity to ask questions and all were answered. The patient agreed with the plan and demonstrated an understanding of the instructions.   The patient was advised to call back or seek an in-person evaluation if the symptoms worsen or if the condition fails to improve as anticipated.      Orvan July, NP  10/11/2018 12:13 PM         Orvan July, NP 10/11/18 1230

## 2018-10-11 NOTE — Discharge Instructions (Signed)
Warm compresses and warm soaks as we discussed.  You can take ibuprofen for pain.  Make sure you massage the area to try to get the area to drain. Keflex for infection Follow up as needed for continued or worsening symptoms
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
## 2020-07-14 ENCOUNTER — Other Ambulatory Visit (HOSPITAL_COMMUNITY): Payer: Self-pay | Admitting: Sports Medicine

## 2020-07-14 ENCOUNTER — Other Ambulatory Visit: Payer: Self-pay | Admitting: Sports Medicine

## 2020-07-14 DIAGNOSIS — M25571 Pain in right ankle and joints of right foot: Secondary | ICD-10-CM

## 2020-07-14 DIAGNOSIS — M25471 Effusion, right ankle: Secondary | ICD-10-CM

## 2020-07-14 DIAGNOSIS — M76821 Posterior tibial tendinitis, right leg: Secondary | ICD-10-CM

## 2020-07-14 DIAGNOSIS — M2141 Flat foot [pes planus] (acquired), right foot: Secondary | ICD-10-CM

## 2020-07-14 DIAGNOSIS — G8929 Other chronic pain: Secondary | ICD-10-CM

## 2020-07-23 ENCOUNTER — Other Ambulatory Visit: Payer: Self-pay

## 2020-07-23 ENCOUNTER — Ambulatory Visit
Admission: RE | Admit: 2020-07-23 | Discharge: 2020-07-23 | Disposition: A | Discharge status: Home / Self Care | Payer: Federal, State, Local not specified - PPO | Source: Ambulatory Visit | Attending: Sports Medicine | Admitting: Sports Medicine

## 2020-07-23 DIAGNOSIS — M2141 Flat foot [pes planus] (acquired), right foot: Secondary | ICD-10-CM | POA: Insufficient documentation

## 2020-07-23 DIAGNOSIS — M76821 Posterior tibial tendinitis, right leg: Secondary | ICD-10-CM | POA: Insufficient documentation

## 2020-07-23 DIAGNOSIS — G8929 Other chronic pain: Secondary | ICD-10-CM | POA: Insufficient documentation

## 2020-07-23 DIAGNOSIS — M25471 Effusion, right ankle: Secondary | ICD-10-CM | POA: Diagnosis present

## 2020-07-23 DIAGNOSIS — M25571 Pain in right ankle and joints of right foot: Secondary | ICD-10-CM | POA: Diagnosis not present

## 2020-09-14 ENCOUNTER — Other Ambulatory Visit: Payer: Self-pay | Admitting: Podiatry

## 2020-09-27 ENCOUNTER — Encounter: Payer: Self-pay | Admitting: Podiatry

## 2020-10-03 NOTE — Discharge Instructions (Addendum)
[place]  POST OPERATIVE INSTRUCTIONS FOR DR. Vickki Muff AND DR. Corning   Take your medication as prescribed.  Pain medication should be taken only as needed.  Take pain medicine 1 tablet every 6 hours as needed.  If pain is still severe after that time and uncontrolled try taking Tylenol and/or ibuprofen between doses.  If pain is still uncontrolled after that point then try taking 1 pain tablet every 4 hours with ibuprofen and Tylenol between doses.  If pain is still uncontrolled and very severe then try taking 2 pain tablets every 4 hours.  Try to take pain medication though as needed as prescribed every 6 hours.  This pain prescription should last to 1 week at least.  Keep the dressing clean, dry and intact.  Keep your foot elevated above the heart level for the first 48 hours.  Continue elevation thereafter to improve swelling.  You may also put ice to the front of your right ankle as well as top of the right foot for maximum 10 minutes out of every 1 hour as needed.  Remain nonweightbearing to the right lower extremity at all times.  Begin taking aspirin 81 mg twice daily starting tomorrow.  Take antibiotic medication as prescribed until gone.  Do not take a shower. Baths are permissible as long as the foot is kept out of the water.   Every hour you are awake:  Bend your knee 15 times. Massage calf 15 times  Call [facility] [phone]) if any of the following problems occur: You develop a temperature or fever. The bandage becomes saturated with blood. Medication does not stop your pain. Injury of the foot occurs. Any symptoms of infection including redness, odor, or red streaks running from wound.   PERIPHERAL NERVE BLOCK PATIENT INFORMATION  Your surgeon has requested a peripheral nerve block for your surgery. This anesthetic technique provides excellent post-operative pain relief for you in a  safe and effective manner. It will also help reduce the risk of nausea and vomiting and allow earlier discharge from the hospital.   The block is performed under sedation with ultrasound guidance prior to your procedure. Due to the sedation, your may or may not remember the block experience. The nerve block will begin to take effect anywhere from 5 to 30 minutes after being administered. You will be transported to the operating room from your surgery after the block is completed.   At the end of surgery, when the anesthesia wears off, you will notice a few things. Your may not be able to move or feel the part of your body targeted by the nerve block. These are normal experiences, and they will disappear as the block wears off.  If you had an interscalene nerve block performed (which is common for shoulder surgery), your voice can be very hoarse and you may feel that you are not able to take as deep a breath as you did before surgery. Some patients may also notice a droopy eyelid on the affected side. These symptoms will resolve once the block wears off.  Pain control: The nerve block technique used is a single injection that can last anywhere from 1-3 days. The duration of the numbness can vary between individuals. After leaving the hospital, it is important that you begin to take your prescribed pain medication when you start to sense the nerve block wearing off. This will help you avoid unpleasant pain at the time the  nerve block wears off, which can sometimes be in the middle of the night. The block will only cover pain in the areas targeted by the nerve block so if you experience surgical pain outside of that area, please take your prescribed pain medication. Management of the "numb area": After a nerve block, you cannot feel pain, pressure, or temperature in the affected area so there is an increased risk for injury. You should take extra care to protect the affected areas until sensation and movement  returns. Please take caution to not come in contact with extremely hot or cold items because you will not be able to sense or protect yourself form the extremes of temperature.  You may experience some persistent numbness after the procedure by most neurological deficits resolve over time and the incidence of serious long term neurological complications attributable to peripheral nerve blocks are relatively uncommon.     Information for Discharge Teaching: EXPAREL (bupivacaine liposome injectable suspension)   Your surgeon or anesthesiologist gave you EXPAREL(bupivacaine) to help control your pain after surgery.  EXPAREL is a local anesthetic that provides pain relief by numbing the tissue around the surgical site. EXPAREL is designed to release pain medication over time and can control pain for up to 72 hours. Depending on how you respond to EXPAREL, you may require less pain medication during your recovery.  Possible side effects: Temporary loss of sensation or ability to move in the area where bupivacaine was injected. Nausea, vomiting, constipation Rarely, numbness and tingling in your mouth or lips, lightheadedness, or anxiety may occur. Call your doctor right away if you think you may be experiencing any of these sensations, or if you have other questions regarding possible side effects.  Follow all other discharge instructions given to you by your surgeon or nurse. Eat a healthy diet and drink plenty of water or other fluids.  If you return to the hospital for any reason within 96 hours following the administration of EXPAREL, it is important for health care providers to know that you have received this anesthetic. A teal colored band has been placed on your arm with the date, time and amount of EXPAREL you have received in order to alert and inform your health care providers. Please leave this armband in place for the full 96 hours following administration, and then you may remove the band.

## 2020-10-13 ENCOUNTER — Encounter: Payer: Self-pay | Admitting: Podiatry

## 2020-10-13 ENCOUNTER — Encounter
Admission: RE | Disposition: A | Discharge status: Home / Self Care | Payer: Self-pay | Source: Ambulatory Visit | Attending: Podiatry

## 2020-10-13 ENCOUNTER — Other Ambulatory Visit: Payer: Self-pay

## 2020-10-13 ENCOUNTER — Ambulatory Visit
Admission: RE | Admit: 2020-10-13 | Discharge: 2020-10-13 | Disposition: A | Discharge status: Home / Self Care | Payer: Federal, State, Local not specified - PPO | Source: Ambulatory Visit | Attending: Podiatry | Admitting: Podiatry

## 2020-10-13 ENCOUNTER — Ambulatory Visit: Payer: Federal, State, Local not specified - PPO | Admitting: Anesthesiology

## 2020-10-13 DIAGNOSIS — M85671 Other cyst of bone, right ankle and foot: Secondary | ICD-10-CM | POA: Diagnosis not present

## 2020-10-13 DIAGNOSIS — Z09 Encounter for follow-up examination after completed treatment for conditions other than malignant neoplasm: Secondary | ICD-10-CM

## 2020-10-13 DIAGNOSIS — M216X1 Other acquired deformities of right foot: Secondary | ICD-10-CM | POA: Insufficient documentation

## 2020-10-13 DIAGNOSIS — Z8673 Personal history of transient ischemic attack (TIA), and cerebral infarction without residual deficits: Secondary | ICD-10-CM | POA: Insufficient documentation

## 2020-10-13 DIAGNOSIS — E669 Obesity, unspecified: Secondary | ICD-10-CM | POA: Insufficient documentation

## 2020-10-13 DIAGNOSIS — M2141 Flat foot [pes planus] (acquired), right foot: Secondary | ICD-10-CM | POA: Insufficient documentation

## 2020-10-13 DIAGNOSIS — Z6841 Body Mass Index (BMI) 40.0 and over, adult: Secondary | ICD-10-CM | POA: Diagnosis not present

## 2020-10-13 DIAGNOSIS — M25871 Other specified joint disorders, right ankle and foot: Secondary | ICD-10-CM | POA: Insufficient documentation

## 2020-10-13 DIAGNOSIS — M659 Synovitis and tenosynovitis, unspecified: Secondary | ICD-10-CM | POA: Insufficient documentation

## 2020-10-13 HISTORY — PX: GASTROC RECESSION EXTREMITY: SHX6262

## 2020-10-13 HISTORY — PX: FLAT FOOT CORRECTION: SHX6619

## 2020-10-13 SURGERY — RECESSION, TENDON, GASTROCNEMIUS
Anesthesia: Regional | Site: Leg Lower | Laterality: Right

## 2020-10-13 MED ORDER — PHENYLEPHRINE HCL (PRESSORS) 10 MG/ML IV SOLN
INTRAVENOUS | Status: DC | PRN
  Administered 2020-10-13 (×3): 100 ug via INTRAVENOUS

## 2020-10-13 MED ORDER — SUCCINYLCHOLINE CHLORIDE 200 MG/10ML IV SOSY
PREFILLED_SYRINGE | INTRAVENOUS | Status: DC | PRN
  Administered 2020-10-13: 120 mg via INTRAVENOUS

## 2020-10-13 MED ORDER — ACETAMINOPHEN 160 MG/5ML PO SOLN: 325.0000 mg | ORAL | Status: DC | PRN

## 2020-10-13 MED ORDER — LIDOCAINE HCL (CARDIAC) PF 100 MG/5ML IV SOSY
PREFILLED_SYRINGE | INTRAVENOUS | Status: DC | PRN
  Administered 2020-10-13: 60 mg via INTRAVENOUS

## 2020-10-13 MED ORDER — CEFAZOLIN SODIUM-DEXTROSE 2-4 GM/100ML-% IV SOLN
2.0000 g | INTRAVENOUS | Status: AC
  Administered 2020-10-13: 2 g via INTRAVENOUS

## 2020-10-13 MED ORDER — MIDAZOLAM HCL 5 MG/5ML IJ SOLN
INTRAMUSCULAR | Status: DC | PRN
  Administered 2020-10-13: 2 mg via INTRAVENOUS

## 2020-10-13 MED ORDER — FENTANYL CITRATE PF 50 MCG/ML IJ SOSY
25.0000 ug | PREFILLED_SYRINGE | INTRAMUSCULAR | Status: DC | PRN
  Administered 2020-10-13: 50 ug via INTRAVENOUS

## 2020-10-13 MED ORDER — DEXAMETHASONE SODIUM PHOSPHATE 4 MG/ML IJ SOLN
INTRAMUSCULAR | Status: DC | PRN
  Administered 2020-10-13: 4 mg via INTRAVENOUS

## 2020-10-13 MED ORDER — LACTATED RINGERS IV SOLN: INTRAVENOUS | Status: DC

## 2020-10-13 MED ORDER — POVIDONE-IODINE 7.5 % EX SOLN: Freq: Once | CUTANEOUS | Status: AC

## 2020-10-13 MED ORDER — BUPIVACAINE HCL (PF) 0.5 % IJ SOLN
INTRAMUSCULAR | Status: DC | PRN
  Administered 2020-10-13: 20 mL via PERINEURAL

## 2020-10-13 MED ORDER — PROPOFOL 10 MG/ML IV BOLUS
INTRAVENOUS | Status: DC | PRN
  Administered 2020-10-13: 30 mg via INTRAVENOUS
  Administered 2020-10-13: 100 mg via INTRAVENOUS
  Administered 2020-10-13: 200 mg via INTRAVENOUS

## 2020-10-13 MED ORDER — ONDANSETRON HCL 4 MG/2ML IJ SOLN
4.0000 mg | Freq: Once | INTRAMUSCULAR | Status: AC
  Administered 2020-10-13: 4 mg via INTRAVENOUS

## 2020-10-13 MED ORDER — AMOXICILLIN-POT CLAVULANATE 875-125 MG PO TABS: 1.0000 | ORAL_TABLET | Freq: Two times a day (BID) | ORAL | 0 refills | Status: AC

## 2020-10-13 MED ORDER — OXYCODONE HCL 5 MG/5ML PO SOLN: 5.0000 mg | Freq: Once | ORAL | Status: AC | PRN

## 2020-10-13 MED ORDER — GLYCOPYRROLATE 0.2 MG/ML IJ SOLN
INTRAMUSCULAR | Status: DC | PRN
  Administered 2020-10-13 (×2): .1 mg via INTRAVENOUS

## 2020-10-13 MED ORDER — FENTANYL CITRATE (PF) 100 MCG/2ML IJ SOLN
INTRAMUSCULAR | Status: DC | PRN
  Administered 2020-10-13: 50 ug via INTRAVENOUS

## 2020-10-13 MED ORDER — ACETAMINOPHEN 325 MG PO TABS: 325.0000 mg | ORAL_TABLET | ORAL | Status: DC | PRN

## 2020-10-13 MED ORDER — ASPIRIN EC 81 MG PO TBEC: 81.0000 mg | DELAYED_RELEASE_TABLET | Freq: Two times a day (BID) | ORAL | 0 refills | Status: AC

## 2020-10-13 MED ORDER — 0.9 % SODIUM CHLORIDE (POUR BTL) OPTIME
TOPICAL | Status: DC | PRN
  Administered 2020-10-13: 1000 mL

## 2020-10-13 MED ORDER — BUPIVACAINE LIPOSOME 1.3 % IJ SUSP
INTRAMUSCULAR | Status: DC | PRN
  Administered 2020-10-13: 20 mL via PERINEURAL

## 2020-10-13 MED ORDER — OXYCODONE HCL 5 MG PO TABS
5.0000 mg | ORAL_TABLET | Freq: Once | ORAL | Status: AC | PRN
  Administered 2020-10-13: 5 mg via ORAL

## 2020-10-13 MED ORDER — OXYCODONE-ACETAMINOPHEN 7.5-325 MG PO TABS: 1.0000 | ORAL_TABLET | Freq: Four times a day (QID) | ORAL | 0 refills | Status: AC | PRN

## 2020-10-13 SURGICAL SUPPLY — 53 items
BIT DRILL CANNULTD 2.6 X 130MM (DRILL) ×2 IMPLANT
BLADE OSCILLATING/SAGITTAL (BLADE) ×3
BLADE SURG 15 STRL LF DISP TIS (BLADE) ×2 IMPLANT
BLADE SURG 15 STRL SS (BLADE) ×3
BLADE SW THK.38XMED LNG THN (BLADE) ×2 IMPLANT
BNDG CMPR STD VLCR NS LF 5.8X4 (GAUZE/BANDAGES/DRESSINGS) ×2
BNDG CMPR STD VLCR NS LF 5.8X6 (GAUZE/BANDAGES/DRESSINGS) ×2
BNDG ELASTIC 4X5.8 VLCR NS LF (GAUZE/BANDAGES/DRESSINGS) ×3 IMPLANT
BNDG ELASTIC 6X5.8 VLCR NS LF (GAUZE/BANDAGES/DRESSINGS) ×3 IMPLANT
BNDG ESMARK 4X12 TAN STRL LF (GAUZE/BANDAGES/DRESSINGS) ×3 IMPLANT
BNDG GAUZE ELAST 4 BULKY (GAUZE/BANDAGES/DRESSINGS) ×6 IMPLANT
CANISTER SUCT 1200ML W/VALVE (MISCELLANEOUS) ×3 IMPLANT
COUNTERSICK 4.0 HEADED (MISCELLANEOUS) ×3
COVER LIGHT HANDLE UNIVERSAL (MISCELLANEOUS) ×6 IMPLANT
CUFF TOURN SGL QUICK 34 (TOURNIQUET CUFF) ×3
CUFF TRNQT CYL 34X4.125X (TOURNIQUET CUFF) ×2 IMPLANT
DRAPE FLUOR MINI C-ARM 54X84 (DRAPES) ×3 IMPLANT
DRAPE ORTHO SPLIT 77X108 STRL (DRAPES) ×6
DRAPE SURG ORHT 6 SPLT 77X108 (DRAPES) ×4 IMPLANT
DRILL CANNULATED 2.6 X 130MM (DRILL) ×3
DRSG TEGADERM 4X4.75 (GAUZE/BANDAGES/DRESSINGS) ×3 IMPLANT
DURAPREP 26ML APPLICATOR (WOUND CARE) ×3 IMPLANT
ELECT REM PT RETURN 9FT ADLT (ELECTROSURGICAL) ×3
ELECTRODE REM PT RTRN 9FT ADLT (ELECTROSURGICAL) ×2 IMPLANT
GAUZE SPONGE 4X4 12PLY STRL (GAUZE/BANDAGES/DRESSINGS) ×6 IMPLANT
GAUZE XEROFORM 1X8 LF (GAUZE/BANDAGES/DRESSINGS) ×3 IMPLANT
GLOVE SURG ENC MOIS LTX SZ7 (GLOVE) ×3 IMPLANT
GLOVE SURG POLYISO LF SZ6.5 (GLOVE) ×9 IMPLANT
GLOVE SURG POLYISO LF SZ7 (GLOVE) ×3 IMPLANT
GOWN STRL REUS W/ TWL LRG LVL3 (GOWN DISPOSABLE) ×4 IMPLANT
GOWN STRL REUS W/TWL LRG LVL3 (GOWN DISPOSABLE) ×6
K-WIRE SMOOTH 2.3X150 SGL TIP (WIRE) ×6
K-WIRE SNGL END 1.2X150 (MISCELLANEOUS) ×6
KIT TURNOVER KIT A (KITS) ×3 IMPLANT
KWIRE SMOOTH 2.3X150 SGL TIP (WIRE) ×4 IMPLANT
KWIRE SNGL END 1.2X150 (MISCELLANEOUS) ×4 IMPLANT
NS IRRIG 500ML POUR BTL (IV SOLUTION) ×3 IMPLANT
PACK EXTREMITY ARMC (MISCELLANEOUS) ×3 IMPLANT
PADDING CAST BLEND 4X4 NS (MISCELLANEOUS) ×12 IMPLANT
PENCIL SMOKE EVACUATOR (MISCELLANEOUS) ×3 IMPLANT
SCREW CANN FT 4X40 (Screw) ×3 IMPLANT
SCREW COUNTERSINK 4.0 HEADED (MISCELLANEOUS) ×2 IMPLANT
SPLINT CAST 1 STEP 4X30 (MISCELLANEOUS) ×3 IMPLANT
STAPLER SKIN PROX 35W (STAPLE) ×3 IMPLANT
STOCKINETTE STRL 6IN 960660 (GAUZE/BANDAGES/DRESSINGS) ×3 IMPLANT
SUT ETHILON 3-0 (SUTURE) ×6 IMPLANT
SUT VIC AB 3-0 SH 27 (SUTURE) ×6
SUT VIC AB 3-0 SH 27X BRD (SUTURE) ×4 IMPLANT
SUT VIC AB 4-0 FS2 27 (SUTURE) ×3 IMPLANT
TUBING CONNECTING 10 (TUBING) ×3 IMPLANT
WEDGE COTTON 6 (Miscellaneous) ×3 IMPLANT
WEDGE EVANS LG 6X19 (Miscellaneous) ×3 IMPLANT
WEDGE EVANS LG 8X19 (Miscellaneous) ×3 IMPLANT

## 2020-10-13 NOTE — Anesthesia Preprocedure Evaluation (Signed)
Anesthesia Evaluation  Patient identified by MRN, date of birth, ID band Patient awake    Reviewed: Allergy & Precautions, NPO status   Airway Mallampati: II  TM Distance: >3 FB     Dental   Pulmonary Current Smoker and Patient abstained from smoking.,    Pulmonary exam normal        Cardiovascular hypertension,  Rhythm:Regular Rate:Normal     Neuro/Psych PSYCHIATRIC DISORDERS Anxiety Depression Bipolar Disorder CVA    GI/Hepatic GERD  ,  Endo/Other  Morbid obesity  Renal/GU      Musculoskeletal   Abdominal   Peds  Hematology   Anesthesia Other Findings   Reproductive/Obstetrics                             Anesthesia Physical Anesthesia Plan  ASA: 3  Anesthesia Plan: General and Regional   Post-op Pain Management:  Regional for Post-op pain   Induction: Intravenous  PONV Risk Score and Plan: Ondansetron, Dexamethasone, Midazolam and Treatment may vary due to age or medical condition  Airway Management Planned: Oral ETT  Additional Equipment:   Intra-op Plan:   Post-operative Plan:   Informed Consent: I have reviewed the patients History and Physical, chart, labs and discussed the procedure including the risks, benefits and alternatives for the proposed anesthesia with the patient or authorized representative who has indicated his/her understanding and acceptance.     Dental advisory given  Plan Discussed with: CRNA  Anesthesia Plan Comments:         Anesthesia Quick Evaluation

## 2020-10-13 NOTE — Anesthesia Postprocedure Evaluation (Signed)
Anesthesia Post Note  Patient: Virginia Webb  Procedure(s) Performed: GASTROC RECESSION (Right: Leg Lower) FLAT FOOT CORRECTION-EVANS OSTEOTOMY, COTTON OSTEOTOMY (Right: Foot)     Patient location during evaluation: PACU Anesthesia Type: Regional Level of consciousness: awake Pain management: pain level controlled Vital Signs Assessment: post-procedure vital signs reviewed and stable Respiratory status: respiratory function stable Cardiovascular status: stable Postop Assessment: no signs of nausea or vomiting Anesthetic complications: no   No notable events documented.  Veda Canning

## 2020-10-13 NOTE — Op Note (Signed)
PODIATRY / FOOT AND ANKLE SURGERY OPERATIVE REPORT    SURGEON: Caroline More, DPM  PRE-OPERATIVE DIAGNOSIS: All right foot 1.  Pes planus 2.  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  Obesity 4.  Equinus  POST-OPERATIVE DIAGNOSIS: Same  PROCEDURE(S): Right gastroc recession Right Evans calcaneal osteotomy Right cotton osteotomy  HEMOSTASIS: Right thigh tourniquet  ANESTHESIA: general  ESTIMATED BLOOD LOSS: 20 cc  FINDING(S): 1.  Pes planus foot type  PATHOLOGY/SPECIMEN(S): None  INDICATIONS:   MUSETTE KISAMORE is a 45 y.o. female who presents with a flatfoot deformity and pain at the level of the sinus tarsi.  Patient has been treated by orthopedics and had an MRI taken as well as steroid injections.  The MRI did show bone marrow edema at the anterior process of the calcaneus at the sinus tarsi level at the articulation of the lateral process of the talus and the calcaneus.  Patient has been treated once again with steroid injections as well as changes in shoe gear, orthoses, bracing, boot therapy but still continued to have pain discomfort.  Discussed all treatment options with patient both conservative and surgical attempts at correction can potential risks and complications at this time patient is elected for surgical procedure described above.  No guarantees given.  DESCRIPTION: After obtaining full informed written consent, the patient was brought back to the operating room and placed prone upon the operating table.  The patient received IV antibiotics prior to induction.  After obtaining adequate anesthesia, the patient was prepped and draped in the standard fashion.  The preop popliteal saphenous nerve block was performed by anesthesia.  An Esmarch bandage was used to exsanguinate the right lower extremity pneumatic thigh tourniquet was inflated.  Attention was then directed to the posterior aspect of the right leg slightly distal  to the myotendinous junction of the gastroc.  The incision was made at the midline of the leg and made approximately 3 to 4 cm.  The incision was deepened through the subcutaneous tissues utilizing sharp and blunt dissection care was taken to identify and retract all vital neurovascular structures no venous contributories were cauterized as necessary.  At this time the deep fascia was identified and transected.  The gastroc aponeurosis was identified and transected from medial to lateral along with the plantaris tendon.  Increased dorsiflexion was noted across the ankle joint with the leg in an extended position to approximately 10 degrees, greatly improved compared to preoperative state.  The surgical site was flushed with copious amounts normal sterile saline.  The deep fascia was then reapproximated well coapted with 3-0 Vicryl along with subcutaneous tissue.  The skin was then reapproximated well coapted with skin staples.  A Xeroform and 4 x 4 as well as Tegaderm was placed and the pneumatic thigh tourniquet was deflated and a prompt hyperemic response was noted all digits of the right foot.  All the drapes were removed.  The patient was then rotated into a supine position and reprepped and draped.  An Esmarch bandage was used to exsanguinate the right lower extremity and pneumatic thigh tourniquet was inflated again.  Attention was then directed to the right lateral foot at the lateral calcaneus slightly proximal to the anterior process.  C-arm imaging was utilized to verify correct placement of incision.  Incision was then made over the lateral aspect of the calcaneus at the anterior process and directed toward the lateral process of the talus.  The incision was deepened  through the subcutaneous tissues utilizing sharp and blunt dissection and care was taken to identify and retract all vital neurovascular structures all venous contributories were cauterized as necessary.  At this time an incision was made  into the periosteum at this level and the periosteum was dissected dorsally and plantarly thereby exposing the lateral wall of the calcaneus at the anterior process level.  The peroneal tendons were identified during this and retracted plantarly throughout the remainder the case.  At this time under fluoroscopic guidance an Evans calcaneal osteotomy was created.  The Evans calcaneal osteotomy was then distracted with a intermittent distractor with pin distraction.  The correction was then obtained with an 8 mm trial wedge.  C-arm imaging was utilized to verify correct position as well as coverage of talar head which appeared to be excellent overall as it appeared to be completely covered with an 8 mm trial wedge.  At this time the Paragon 28 3D titanium wedge 8 mm was then placed into the Evans calcaneal osteotomy site with excellent compression noted.  The instrument distractor was removed.  The for the screw 3.5 mm cannulated screw was then placed into the Evans wedge and utilizing AO principles and techniques a 40 mm 3.5 cannulated screw was then placed across the anterior process the calcaneus across the wedge and into the body of the calcaneus with excellent compression noted.  C-arm imaging was utilized to verify correct position which appeared to be excellent overall and the correction appeared to be well-maintained.  Attention was then directed to the dorsal aspect of the right foot over the medial cuneiform.  C-arm imaging was utilized to verify position of incision and incision was marked and made centrally and dorsally over the area.  The incision was deepened to the subcutaneous tissues utilizing sharp and blunt dissection and care taken to identify and retract all vital neurovascular structures and all venous contributories were cauterized as necessary.  A incision was made into the deep fascia and the extensor hallucis longus tendon was identified and retracted laterally as well as the tibialis  anterior tendon.  A periosteal incision was made through this area and the periosteum was reflected medially and laterally thereby exposing the medial cuneiform.  The first tarsometatarsal joint was also identified as well as the naviculocuneiform joint.  At this time a guidewire was then placed through the central aspect of the medial cuneiform and checked under fluoroscopic guidance and used as a guidepin for the osteotomy.  At this time a cotton osteotomy was then performed from dorsal plantar across the medial cuneiform.  The pin distractor was then placed again and the osteotomy was opened up.  A 5 mm trial wedge was then placed in and noted to be slightly small so a 6 mm trial wedge was used instead.  A 6 mm wedge appeared to align the talus with the first metatarsal.  The trial wedge appeared to fit well overall.  A 6 mm 3D Paragon 28 cotton wedge was then placed into the osteotomy site with excellent compression after the pin distractor and pins were removed.  Final C-arm imaging was then taken showing complete coverage of the talar head.  The talus also appeared to be in alignment with the first metatarsal.  Excellent compression appeared to be across the osteotomy sites with excellent placement of grafts.  The screw going across the Evans wedge appeared to have excellent compression overall as well with the appropriate size and length.  The surgical sites were  flushed with copious amounts normal sterile saline.  The deep fascia and periosteum at both sites was reapproximated well coapted with 3-0 Vicryl.  The subcutaneous tissue was then reapproximated well coapted with 4-0 Vicryl.  The skin was then reapproximated well coapted with 4-0 nylon in a combination of simple and horizontal mattress type stitching.  A postoperative dressing was applied consisting of Xeroform to the incision lines followed by 4 x 4 gauze, Kerlix, web roll, posterior splint, Ace wrap.  The pneumatic thigh tourniquet was deflated  and a prompt hyperemic response was noted all digits of the right foot.  The patient tolerated the procedure and anesthesia well was transferred to recovery room vital signs stable vascular status intact all toes the right foot.  Patient will be discharged home with the appropriate orders and follow-up instructions.  Patient is to remain nonweightbearing at all times.  COMPLICATIONS: None  CONDITION: Good, stable  Caroline More, DPM

## 2020-10-13 NOTE — Anesthesia Procedure Notes (Signed)
Anesthesia Regional Block: Adductor canal block   Pre-Anesthetic Checklist: , timeout performed,  Correct Patient, Correct Site, Correct Laterality,  Correct Procedure, Correct Position, site marked,  Risks and benefits discussed,  Surgical consent,  Pre-op evaluation,  At surgeon's request and post-op pain management  Laterality: Right  Prep: chloraprep       Needles:  Injection technique: Single-shot  Needle Type: Echogenic Needle     Needle Length: 9cm  Needle Gauge: 21     Additional Needles:   Procedures:,,,, ultrasound used (permanent image in chart),,    Narrative:  Start time: 10/13/2020 11:26 AM End time: 10/13/2020 11:28 AM Injection made incrementally with aspirations every 5 mL.  Performed by: Personally  Anesthesiologist: Veda Canning, MD  Additional Notes: Functioning IV was confirmed and monitors applied. Ultrasound guidance: relevant anatomy identified, needle position confirmed, local anesthetic spread visualized around nerve(s)., vascular puncture avoided.  Image printed for medical record.  Negative aspiration and no paresthesias; incremental administration of local anesthetic. The patient tolerated the procedure well. Vitals signs recorded in RN notes.  61mL 0.5% bupivacaine + 15mL Exparel for total of 10 mL in adductor canal block

## 2020-10-13 NOTE — Anesthesia Procedure Notes (Signed)
Anesthesia Regional Block: Popliteal block   Pre-Anesthetic Checklist: , timeout performed,  Correct Patient, Correct Site, Correct Laterality,  Correct Procedure, Correct Position, site marked,  Risks and benefits discussed,  Surgical consent,  Pre-op evaluation,  At surgeon's request and post-op pain management  Laterality: Right  Prep: chloraprep       Needles:  Injection technique: Single-shot  Needle Type: Echogenic Needle     Needle Length: 9cm  Needle Gauge: 21     Additional Needles:   Procedures:,,,, ultrasound used (permanent image in chart),,    Narrative:  Start time: 10/13/2020 11:18 AM End time: 10/13/2020 11:25 AM Injection made incrementally with aspirations every 5 mL.  Performed by: Personally  Anesthesiologist: Veda Canning, MD  Additional Notes: Functioning IV was confirmed and monitors applied. Ultrasound guidance: relevant anatomy identified, needle position confirmed, local anesthetic spread visualized around nerve(s)., vascular puncture avoided.  Image printed for medical record.  Negative aspiration and no paresthesias; incremental administration of local anesthetic. The patient tolerated the procedure well. Vitals signs recorded in RN notes.  74mL 0.5% bupivacaine + 52mL Exparel for total of 30 mL in popliteal block

## 2020-10-13 NOTE — Anesthesia Procedure Notes (Signed)
Procedure Name: Intubation Date/Time: 10/13/2020 1:16 PM Performed by: Silvana Newness, CRNA Pre-anesthesia Checklist: Patient identified, Emergency Drugs available, Suction available, Patient being monitored and Timeout performed Patient Re-evaluated:Patient Re-evaluated prior to induction Oxygen Delivery Method: Circle system utilized Preoxygenation: Pre-oxygenation with 100% oxygen Induction Type: IV induction Ventilation: Mask ventilation without difficulty Laryngoscope Size: Mac and 3 Grade View: Grade I Tube type: Oral Tube size: 7.0 mm Number of attempts: 1 Airway Equipment and Method: Stylet Placement Confirmation: ETT inserted through vocal cords under direct vision, positive ETCO2 and breath sounds checked- equal and bilateral Secured at: 21 (lip) cm Tube secured with: Tape Dental Injury: Teeth and Oropharynx as per pre-operative assessment

## 2020-10-13 NOTE — Transfer of Care (Signed)
Immediate Anesthesia Transfer of Care Note  Patient: Virginia Webb  Procedure(s) Performed: GASTROC RECESSION (Right: Leg Lower) FLAT FOOT CORRECTION-EVANS OSTEOTOMY, COTTON OSTEOTOMY (Right: Foot)  Patient Location: PACU  Anesthesia Type: General, Regional  Level of Consciousness: awake, alert  and patient cooperative  Airway and Oxygen Therapy: Patient Spontanous Breathing and Patient connected to supplemental oxygen  Post-op Assessment: Post-op Vital signs reviewed, Patient's Cardiovascular Status Stable, Respiratory Function Stable, Patent Airway and No signs of Nausea or vomiting  Post-op Vital Signs: Reviewed and stable  Complications: No notable events documented.

## 2020-10-13 NOTE — H&P (Signed)
HISTORY AND PHYSICAL INTERVAL NOTE:  10/13/2020  1:04 PM  Virginia Webb  has presented today for surgery, with the diagnosis of M21.4- Pes planus M21.6X- Equinus  M65.9- Synovitis and tenosynovitis.  The various methods of treatment have been discussed with the patient.  No guarantees were given.  After consideration of risks, benefits and other options for treatment, the patient has consented to surgery.  I have reviewed the patients' chart and labs.    PROCEDURE: RIGHT FOOT GASTROC RECESSION EVANS OSTEOTOMY COTTON OSTEOTOMY  A history and physical examination was performed in my office.  The patient was reexamined.  There have been no changes to this history and physical examination.  Caroline More, DPM

## 2020-10-17 ENCOUNTER — Encounter: Payer: Self-pay | Admitting: Podiatry

## 2022-04-03 ENCOUNTER — Other Ambulatory Visit: Payer: Self-pay | Admitting: Podiatry

## 2022-04-03 DIAGNOSIS — T8484XA Pain due to internal orthopedic prosthetic devices, implants and grafts, initial encounter: Secondary | ICD-10-CM

## 2022-04-03 DIAGNOSIS — M19071 Primary osteoarthritis, right ankle and foot: Secondary | ICD-10-CM

## 2022-05-07 ENCOUNTER — Ambulatory Visit
Admission: RE | Admit: 2022-05-07 | Discharge: 2022-05-07 | Disposition: A | Discharge status: Home / Self Care | Payer: Federal, State, Local not specified - PPO | Source: Ambulatory Visit | Attending: Podiatry | Admitting: Podiatry

## 2022-05-07 DIAGNOSIS — T8484XA Pain due to internal orthopedic prosthetic devices, implants and grafts, initial encounter: Secondary | ICD-10-CM | POA: Insufficient documentation

## 2022-05-07 DIAGNOSIS — M19071 Primary osteoarthritis, right ankle and foot: Secondary | ICD-10-CM

## 2022-05-23 ENCOUNTER — Other Ambulatory Visit: Payer: Self-pay

## 2022-05-23 ENCOUNTER — Encounter: Payer: Self-pay | Admitting: Podiatry

## 2022-05-23 NOTE — Anesthesia Preprocedure Evaluation (Addendum)
Anesthesia Evaluation  Patient identified by MRN, date of birth, ID band Patient awake    Reviewed: Allergy & Precautions, H&P , NPO status , Patient's Chart, lab work & pertinent test results  Airway Mallampati: IV  TM Distance: >3 FB Neck ROM: Full    Dental no notable dental hx.    Pulmonary Current Smoker and Patient abstained from smoking.   Pulmonary exam normal breath sounds clear to auscultation       Cardiovascular hypertension, Normal cardiovascular exam Rhythm:Regular Rate:Normal     Neuro/Psych  PSYCHIATRIC DISORDERS Anxiety Depression Bipolar Disorder   CVA negative neurological ROS  negative psych ROS   GI/Hepatic Neg liver ROS,GERD  ,,  Endo/Other  negative endocrine ROS    Renal/GU negative Renal ROS  negative genitourinary   Musculoskeletal negative musculoskeletal ROS (+) Arthritis ,    Abdominal   Peds negative pediatric ROS (+)  Hematology  (+) Blood dyscrasia, anemia   Anesthesia Other Findings Hyperlipidemia  Hypertension Lung nodules  Anxiety and depression Bipolar affective disorder (HCC) Menorrhagia Fatigue  GERD (gastroesophageal reflux disease) Ovarian tumor Stroke (HCC) Iron deficiency anemia  Iron deficiency anemia, unspecified Post surgical complication  Arthritis Seasonal allergies     Reproductive/Obstetrics negative OB ROS                              Anesthesia Physical Anesthesia Plan  ASA: 2  Anesthesia Plan: General   Post-op Pain Management:    Induction: Intravenous  PONV Risk Score and Plan:   Airway Management Planned: Natural Airway and Nasal Cannula  Additional Equipment:   Intra-op Plan:   Post-operative Plan:   Informed Consent: I have reviewed the patients History and Physical, chart, labs and discussed the procedure including the risks, benefits and alternatives for the proposed anesthesia with the patient or  authorized representative who has indicated his/her understanding and acceptance.     Dental Advisory Given  Plan Discussed with: Anesthesiologist, CRNA and Surgeon  Anesthesia Plan Comments: (Patient consented for risks of anesthesia including but not limited to:  - adverse reactions to medications - risk of airway placement if required - damage to eyes, teeth, lips or other oral mucosa - nerve damage due to positioning  - sore throat or hoarseness - Damage to heart, brain, nerves, lungs, other parts of body or loss of life  Patient voiced understanding.)         Anesthesia Quick Evaluation

## 2022-05-24 NOTE — Discharge Instructions (Addendum)
[facility]  POST OPERATIVE INSTRUCTIONS FOR DR. Ether Griffins AND DR. Arnie Clingenpeel [facility] PODIATRY DEPARTMENT   Take your medication as prescribed.  Pain medication should be taken only as needed.  You may take Tylenol between pain medication doses as well.  Maximum dose of Tylenol per day is 4000 mg a day.  If pain is still severe then you may take 1 pain tablet every 4 hours.  If still severe then try taking 2 pain tablets every 6 hours.  If still severe then try taking 2 pain tablets every 4 hours.  Try to take the pain medication as prescribed as needed for severe pain.  Keep the dressing clean, dry and intact.  Remain nonweightbearing at all times to the right lower extremity.  Keep your foot elevated above the heart level for the first 48 hours.  Maintain elevation after that time to improve swelling.  May also apply ice to the back of the right knee or dorsal lateral foot/ankle for maximal 10 minutes out of every 1 hour as needed over the splint.  Walking to the bathroom and brief periods of walking are acceptable, unless we have instructed you to be non-weight bearing.  Start taking antibiotic as prescribed.  Start taking aspirin 81 mg twice daily starting tomorrow.  Do not take a shower. Baths are permissible as long as the foot is kept out of the water.   Every hour you are awake:  Bend your knee 15 times. Massage calf 15 times  Call [facility] [phone]) if any of the following problems occur: You develop a temperature or fever. The bandage becomes saturated with blood. Medication does not stop your pain. Injury of the foot occurs. Any symptoms of infection including redness, odor, or red streaks running from wound.

## 2022-05-25 ENCOUNTER — Other Ambulatory Visit
Admission: RE | Admit: 2022-05-25 | Discharge: 2022-05-25 | Disposition: A | Discharge status: Home / Self Care | Payer: Federal, State, Local not specified - PPO | Source: Ambulatory Visit | Attending: Podiatry | Admitting: Podiatry

## 2022-05-25 ENCOUNTER — Other Ambulatory Visit: Payer: Self-pay | Admitting: Podiatry

## 2022-05-25 DIAGNOSIS — Z01812 Encounter for preprocedural laboratory examination: Secondary | ICD-10-CM | POA: Insufficient documentation

## 2022-05-25 DIAGNOSIS — Z79899 Other long term (current) drug therapy: Secondary | ICD-10-CM | POA: Diagnosis not present

## 2022-05-25 LAB — POTASSIUM: Potassium: 3.6 mmol/L (ref 3.5–5.1)

## 2022-05-31 ENCOUNTER — Ambulatory Visit: Payer: Federal, State, Local not specified - PPO | Admitting: Anesthesiology

## 2022-05-31 ENCOUNTER — Encounter: Payer: Self-pay | Admitting: Podiatry

## 2022-05-31 ENCOUNTER — Ambulatory Visit: Payer: Federal, State, Local not specified - PPO

## 2022-05-31 ENCOUNTER — Other Ambulatory Visit: Payer: Self-pay

## 2022-05-31 ENCOUNTER — Encounter
Admission: RE | Disposition: A | Discharge status: Home / Self Care | Payer: Self-pay | Source: Home / Self Care | Attending: Podiatry

## 2022-05-31 ENCOUNTER — Ambulatory Visit
Admission: RE | Admit: 2022-05-31 | Discharge: 2022-05-31 | Disposition: A | Discharge status: Home / Self Care | Payer: Federal, State, Local not specified - PPO | Attending: Podiatry | Admitting: Podiatry

## 2022-05-31 DIAGNOSIS — E669 Obesity, unspecified: Secondary | ICD-10-CM | POA: Diagnosis not present

## 2022-05-31 DIAGNOSIS — Z79899 Other long term (current) drug therapy: Secondary | ICD-10-CM

## 2022-05-31 DIAGNOSIS — Z6841 Body Mass Index (BMI) 40.0 and over, adult: Secondary | ICD-10-CM | POA: Insufficient documentation

## 2022-05-31 DIAGNOSIS — M2141 Flat foot [pes planus] (acquired), right foot: Secondary | ICD-10-CM | POA: Diagnosis not present

## 2022-05-31 DIAGNOSIS — M19071 Primary osteoarthritis, right ankle and foot: Secondary | ICD-10-CM | POA: Diagnosis present

## 2022-05-31 DIAGNOSIS — F17210 Nicotine dependence, cigarettes, uncomplicated: Secondary | ICD-10-CM | POA: Insufficient documentation

## 2022-05-31 DIAGNOSIS — Z01812 Encounter for preprocedural laboratory examination: Secondary | ICD-10-CM

## 2022-05-31 HISTORY — PX: FOOT ARTHRODESIS: SHX1655

## 2022-05-31 HISTORY — DX: Unspecified osteoarthritis, unspecified site: M19.90

## 2022-05-31 HISTORY — PX: HARDWARE REMOVAL: SHX979

## 2022-05-31 HISTORY — DX: Other seasonal allergic rhinitis: J30.2

## 2022-05-31 SURGERY — FUSION, JOINT, FOOT
Anesthesia: General | Site: Foot | Laterality: Right

## 2022-05-31 MED ORDER — OXYCODONE-ACETAMINOPHEN 7.5-325 MG PO TABS: 1.0000 | ORAL_TABLET | Freq: Four times a day (QID) | ORAL | 0 refills | Status: AC | PRN

## 2022-05-31 MED ORDER — DEXAMETHASONE SODIUM PHOSPHATE 10 MG/ML IJ SOLN
INTRAMUSCULAR | Status: DC | PRN
  Administered 2022-05-31: 4 mg via INTRAVENOUS

## 2022-05-31 MED ORDER — KETOROLAC TROMETHAMINE 60 MG/2ML IM SOLN
INTRAMUSCULAR | Status: DC | PRN
  Administered 2022-05-31: 30 mg via INTRAMUSCULAR

## 2022-05-31 MED ORDER — OXYCODONE HCL 5 MG PO TABS
10.0000 mg | ORAL_TABLET | Freq: Once | ORAL | Status: AC
  Administered 2022-05-31: 10 mg via ORAL

## 2022-05-31 MED ORDER — 0.9 % SODIUM CHLORIDE (POUR BTL) OPTIME
TOPICAL | Status: DC | PRN
  Administered 2022-05-31: 500 mL

## 2022-05-31 MED ORDER — HYDROMORPHONE HCL 1 MG/ML IJ SOLN
0.5000 mg | INTRAMUSCULAR | Status: DC | PRN
  Administered 2022-05-31: 0.5 mg via INTRAVENOUS

## 2022-05-31 MED ORDER — PROPOFOL 10 MG/ML IV BOLUS
INTRAVENOUS | Status: DC | PRN
  Administered 2022-05-31: 300 mg via INTRAVENOUS

## 2022-05-31 MED ORDER — CEFAZOLIN SODIUM-DEXTROSE 2-4 GM/100ML-% IV SOLN
2.0000 g | INTRAVENOUS | Status: AC
  Administered 2022-05-31: 2 g via INTRAVENOUS

## 2022-05-31 MED ORDER — ASPIRIN 81 MG PO TBEC: 81.0000 mg | DELAYED_RELEASE_TABLET | Freq: Two times a day (BID) | ORAL | 0 refills | Status: AC

## 2022-05-31 MED ORDER — ONDANSETRON HCL 4 MG/2ML IJ SOLN
INTRAMUSCULAR | Status: DC | PRN
  Administered 2022-05-31 (×2): 4 mg via INTRAVENOUS

## 2022-05-31 MED ORDER — BUPIVACAINE LIPOSOME 1.3 % IJ SUSP
INTRAMUSCULAR | Status: DC | PRN
  Administered 2022-05-31: 10 mL via PERINEURAL

## 2022-05-31 MED ORDER — AMOXICILLIN-POT CLAVULANATE 875-125 MG PO TABS: 1.0000 | ORAL_TABLET | Freq: Two times a day (BID) | ORAL | 0 refills | Status: AC

## 2022-05-31 MED ORDER — BUPIVACAINE HCL (PF) 0.5 % IJ SOLN
INTRAMUSCULAR | Status: DC | PRN
  Administered 2022-05-31: 20 mL via PERINEURAL

## 2022-05-31 MED ORDER — ONDANSETRON HCL 4 MG PO TABS: 4.0000 mg | ORAL_TABLET | Freq: Three times a day (TID) | ORAL | 0 refills | Status: DC | PRN

## 2022-05-31 MED ORDER — LACTATED RINGERS IV SOLN: INTRAVENOUS | Status: DC

## 2022-05-31 MED ORDER — FENTANYL CITRATE PF 50 MCG/ML IJ SOSY
50.0000 ug | PREFILLED_SYRINGE | INTRAMUSCULAR | Status: DC | PRN
  Administered 2022-05-31: 50 ug via INTRAVENOUS

## 2022-05-31 MED ORDER — LIDOCAINE HCL (CARDIAC) PF 100 MG/5ML IV SOSY
PREFILLED_SYRINGE | INTRAVENOUS | Status: DC | PRN
  Administered 2022-05-31: 100 mg via INTRAVENOUS

## 2022-05-31 MED ORDER — MIDAZOLAM HCL 2 MG/2ML IJ SOLN
2.0000 mg | Freq: Once | INTRAMUSCULAR | Status: AC
  Administered 2022-05-31: 2 mg via INTRAVENOUS

## 2022-05-31 MED ORDER — FENTANYL CITRATE (PF) 100 MCG/2ML IJ SOLN
100.0000 ug | Freq: Once | INTRAMUSCULAR | Status: AC
  Administered 2022-05-31 (×2): 50 ug via INTRAVENOUS

## 2022-05-31 MED ORDER — ACETAMINOPHEN 10 MG/ML IV SOLN
INTRAVENOUS | Status: DC | PRN
  Administered 2022-05-31: 1000 mg via INTRAVENOUS

## 2022-05-31 MED ORDER — FENTANYL CITRATE (PF) 100 MCG/2ML IJ SOLN
INTRAMUSCULAR | Status: DC | PRN
  Administered 2022-05-31: 100 ug via INTRAVENOUS

## 2022-05-31 MED ORDER — DROPERIDOL 2.5 MG/ML IJ SOLN
1.2500 mg | Freq: Once | INTRAMUSCULAR | Status: AC
  Administered 2022-05-31: 1.25 mg via INTRAVENOUS

## 2022-05-31 SURGICAL SUPPLY — 44 items
BIT DRILL 2 FENESTRATED (MISCELLANEOUS) ×1 IMPLANT
BIT DRILL CANNULATED 4.6 (BIT) ×1 IMPLANT
BIT DRILLL 2 FENESTRATED (MISCELLANEOUS) ×2
BNDG CMPR STD VLCR NS LF 5.8X4 (GAUZE/BANDAGES/DRESSINGS) ×2
BNDG CMPR STD VLCR NS LF 5.8X6 (GAUZE/BANDAGES/DRESSINGS) ×2
BNDG ELASTIC 4X5.8 VLCR NS LF (GAUZE/BANDAGES/DRESSINGS) ×2 IMPLANT
BNDG ELASTIC 6X5.8 VLCR NS LF (GAUZE/BANDAGES/DRESSINGS) ×2 IMPLANT
BNDG ESMARCH 4 X 12 STRL LF (GAUZE/BANDAGES/DRESSINGS) ×2
BNDG ESMARCH 4X12 STRL LF (GAUZE/BANDAGES/DRESSINGS) ×2 IMPLANT
BNDG GAUZE DERMACEA FLUFF 4 (GAUZE/BANDAGES/DRESSINGS) ×2 IMPLANT
BNDG GZE DERMACEA 4 6PLY (GAUZE/BANDAGES/DRESSINGS) ×2
CANISTER SUCT 1200ML W/VALVE (MISCELLANEOUS) ×2 IMPLANT
COUNTERSINK 7.0 (MISCELLANEOUS) ×2
COVER LIGHT HANDLE UNIVERSAL (MISCELLANEOUS) ×4 IMPLANT
DRAPE C-ARM XRAY 36X54 (DRAPES) ×1 IMPLANT
DRAPE C-ARMOR (DRAPES) ×1 IMPLANT
DRAPE FLUOR MINI C-ARM 54X84 (DRAPES) ×2 IMPLANT
DURAPREP 26ML APPLICATOR (WOUND CARE) ×2 IMPLANT
ELECT REM PT RETURN 9FT ADLT (ELECTROSURGICAL) ×2
ELECTRODE REM PT RTRN 9FT ADLT (ELECTROSURGICAL) ×2 IMPLANT
GAUZE SPONGE 4X4 12PLY STRL (GAUZE/BANDAGES/DRESSINGS) ×2 IMPLANT
GAUZE XEROFORM 1X8 LF (GAUZE/BANDAGES/DRESSINGS) ×2 IMPLANT
GLOVE BIOGEL PI IND STRL 7.5 (GLOVE) ×2 IMPLANT
GLOVE SURG SS PI 7.0 STRL IVOR (GLOVE) ×2 IMPLANT
GOWN STRL REUS W/ TWL LRG LVL3 (GOWN DISPOSABLE) ×4 IMPLANT
GOWN STRL REUS W/TWL LRG LVL3 (GOWN DISPOSABLE) ×4
GRAFT TRIN ELITE MED MUSC TRAN (Graft) ×1 IMPLANT
K-WIRE SINGLE TROCAR 2.3X230 (WIRE) ×4
KIT TURNOVER KIT A (KITS) ×2 IMPLANT
KWIRE SINGLE TROCAR 2.3X230 (WIRE) ×2 IMPLANT
NS IRRIG 500ML POUR BTL (IV SOLUTION) ×2 IMPLANT
PACK EXTREMITY ARMC (MISCELLANEOUS) ×2 IMPLANT
PADDING CAST BLEND 4X4 NS (MISCELLANEOUS) ×6 IMPLANT
SCREW CANN HDLS 7.0X80 (Screw) ×1 IMPLANT
SCREW CANN HDLS 7X70 (Screw) ×1 IMPLANT
SCREW COUNTERSINK 7.0 (MISCELLANEOUS) ×1 IMPLANT
SPLINT CAST 1 STEP 4X30 (MISCELLANEOUS) ×2 IMPLANT
STAPLER SKIN PROX 35W (STAPLE) ×1 IMPLANT
STIMULATOR BONE (ORTHOPEDIC SUPPLIES) ×2
STIMULATOR BONE GROWTH EMG EXT (ORTHOPEDIC SUPPLIES) ×1 IMPLANT
STOCKINETTE IMPERVIOUS LG (DRAPES) ×2 IMPLANT
SUT ETHILON 3-0 (SUTURE) ×2 IMPLANT
SUT VIC AB 3-0 SH 27 (SUTURE) ×4
SUT VIC AB 3-0 SH 27X BRD (SUTURE) ×2 IMPLANT

## 2022-05-31 NOTE — Anesthesia Postprocedure Evaluation (Signed)
Anesthesia Post Note  Patient: Ralph Dowdy  Procedure(s) Performed: ARTHRODESIS FOOT (Right: Foot) HARDWARE REMOVAL (Right: Foot)  Patient location during evaluation: PACU Anesthesia Type: General Level of consciousness: awake and alert Pain management: pain level controlled Vital Signs Assessment: post-procedure vital signs reviewed and stable Respiratory status: spontaneous breathing, nonlabored ventilation, respiratory function stable and patient connected to nasal cannula oxygen Cardiovascular status: blood pressure returned to baseline and stable Postop Assessment: no apparent nausea or vomiting Anesthetic complications: no   No notable events documented.   Last Vitals:  Vitals:   05/31/22 1015 05/31/22 1021  BP: 129/86   Pulse: (!) 56 (!) 57  Resp: 14 14  Temp: 36.6 C 36.6 C  SpO2: 98% 93%    Last Pain:  Vitals:   05/31/22 1021  TempSrc:   PainSc: Asleep                 Gyneth Hubka C Reggie Bise

## 2022-05-31 NOTE — Op Note (Signed)
PODIATRY / FOOT AND ANKLE SURGERY OPERATIVE REPORT    SURGEON: Virginia Webb, DPM  PRE-OPERATIVE DIAGNOSIS:  1.  Right subtalar joint arthritis 2.  Pes planus foot structure right 3.  Obesity  POST-OPERATIVE DIAGNOSIS: Same  PROCEDURE(S): Right subtalar joint fusion with application of bone graft  Application of bone stimulator Removal of hardware  HEMOSTASIS: Right thigh tourniquet  ANESTHESIA: general  ESTIMATED BLOOD LOSS: 15 cc  FINDING(S): 1.  Mild arthritic changes present to the subtalar joint around the sinus tarsi with a large amount of scar tissue present. 2.  Previous Evans calcaneal wedge appeared to be well incorporated within the anterior process area of the calcaneus where the osteotomy was made.  Did not appear to be overly prominent overall or causing any obstruction to the subtalar joint.  Appeared to be well incorporated with no loosening.  PATHOLOGY/SPECIMEN(S): None  INDICATIONS:   Virginia Webb is a 47 y.o. female who presents with continued chronic pain to the right lateral foot around the sinus tarsi area.  Patient had a flatfoot reconstruction performed around 2 years ago.  She has done much better since the procedure overall but still has some degree of sinus tarsitis.  Patient was treated conservatively further with bracing, injections, changes in shoe gear, orthoses, oral and topical medications but still continued to have pain and discomfort.  A CT scan was taken showing some mild arthritic changes present in the posterior facet of the calcaneus.  The Evans wedge appeared to be slightly prominent dorsally but did not appear to be loosened at all.  All treatment options were discussed with the patient both conservative and surgical attempts at correction include potential risks and complications and at this time patient is elected for surgical procedure consisting of right subtalar joint arthrodesis with possible removal of orthopedic hardware and bone  graft application with application of bone stimulator.  No guarantees given.  Consent obtained prior to procedure.  DESCRIPTION: After obtaining full informed written consent, the patient was brought back to the operating room and placed supine upon the operating table.  The patient received IV antibiotics prior to induction.  After obtaining adequate anesthesia, the patient was prepped and draped in the standard fashion.  Prior to bringing the patient back anesthesia performed a popliteal nerve block.  An Esmarch bandage was used to exsanguinate the right lower extremity and the pneumatic thigh tourniquet was inflated.  Attention was then directed to the right sinus tarsi area where an incision was made starting from the distal tip of the fibula and extending to the calcaneocuboid joint area.  The incision was deepened through the subcutaneous tissues utilizing sharp and blunt dissection and care was taken to identify and retract all vital neurovascular structures and all venous contributories were cauterized as necessary.  At this time electrocautery Bovie dissection was then performed creating a full-thickness flap of the extensor digitorum brevis muscle belly off of the anterior process of the calcaneus.  This flap was then elevated and entry was obtained into the sinus tarsi and the subtalar joint was able to be visualized.  An osteotome was placed into the subtalar joint area and used to cut to the interosseous talocalcaneal ligament.  Once this was performed the osteotome was then placed into the posterior facet and the joint was opened up yet further.  The peroneal tendons were identified during this process and retracted throughout the remainder of the case.  The previous Evans calcaneal osteotomy area was visualized and noted to  have some scar tissue on the dorsal aspect of the wedge.  The wedge appeared to fit almost perfectly in the area and did not appear to be overly prominent overall.  The Evans  calcaneal wedge also appeared to be well incorporated at this time with no loosening.  The screw going through the Evans wedge was removed from [employer] with their hardware removal system for the purposes of the upcoming subtalar joint fusion to fit the hardware in the area.  There did appear to be some erosions present at the most distal aspect of the posterior facet at both the talus and calcaneus level.  Around the sinus tarsi and slightly proximal to the implant.  The remaining of the articular cartilage appeared to be within normal limits around the area.  A lamina spreader was then placed into the sinus tarsi and the subtalar joint was opened.  At this time an osteotome was used to resect articular cartilage from the subtalar joint at the calcaneus and talus levels along with a curette.  Once all the cartilage was removed the area a flush was performed with copious amounts normal sterile saline.  The joint surface was inspected again and not noted to have any remaining cartilage present.  At this time the joint was then prepared yet further with a 2.0 drill bit for fenestration and fish scaling with osteotome and mallet.  The joint was then packed with Trinity bone graft and also packed over the implant for further incorporation at the fusion site.  The calcaneus was then manipulated into a near rectus to slight planus position.  While held in this position temporary fixation wires were placed into the posterior heel into the calcaneus and across the subtalar joint into the talus with the appropriate orientation.  Parallel wires were placed.  C-arm imaging was utilized to verify correct position of wires in both the lateral foot and AP ankle views.  Axial calcaneal imaging was also taken showing screws in the appropriate position.  Small stab incisions were then made around the wires and blunt dissection was continued to the level of bone with a hemostat.  At this time utilizing standard AO principles and  techniques to 7.0 partially-threaded headless cannulated screws that were 70 and 80 mm in length from [employer] 28 were placed across the subtalar joint at the appropriate orientation with excellent compression noted across the joint.  The joint was then inspected further and noted to be well compressed through the surgical site.  The foot appeared to be in a slight planus to fairly rectus position overall.  The temporary fixation wires were removed.  Final C-arm imaging was then taken showing excellent compression across the subtalar joint with screws in the appropriate orientation and of the appropriate length.  The surgical sites were flushed with copious amounts normal sterile saline.  The posterior heel incision was reapproximated well coapted with skin staples.  The capsular and periosteal tissue as well as extensor digitorum brevis muscle belly was reapproximated well coapted with 3-0 Vicryl.  Subcutaneous tissues reapproximated well coapted with 3-0 Vicryl.  The skin at the sinus tarsi incision was then reapproximated well coapted with 3-0 nylon in a combination of simple and horizontal mattress type stitching.    The pneumatic thigh tourniquet was deflated and a prompt hyperemic response was noted to all digits of the right foot.  A postoperative dressing was then applied consisting of Xeroform to the incisional areas followed by 4 x 4 gauze, ABD, Kerlix,  Webril, posterior splint, Ace wrap.  A bone stimulator was also applied to the lateral ankle and sinus tarsi area and patient and family were instructed on usage postop.  The patient tolerated the procedure and anesthesia well and was transferred to recovery room vital signs stable vascular status intact all toes the right foot.  Following a period of postoperative monitoring the patient be discharged home with the appropriate orders, instructions, and medications.  Patient is to remain nonweightbearing at all times for the next 6 weeks.  Patient to  follow-up in outpatient clinic within 1 week of surgical date.  COMPLICATIONS: none  CONDITION: good, stable  Virginia Webb, DPM

## 2022-05-31 NOTE — H&P (Signed)
HISTORY AND PHYSICAL INTERVAL NOTE:  05/31/2022  7:07 AM  Virginia Webb  has presented today for surgery, with the diagnosis of M19.071 - Primary osteoarthritis, ankle and foot; sinus tarsiitis right.  The various methods of treatment have been discussed with the patient.  No guarantees were given.  After consideration of risks, benefits and other options for treatment, the patient has consented to surgery.  I have reviewed the patients' chart and labs.    PROCEDURE: ALL RIGHT FOOT POSSIBLE REMOVAL OF ORTHOPEDIC HARDWARE WITH POSSIBLE GRAFT AND PLATE FIXATION - PREVIOUS EVANS CALCANEAL OSTEOTOMY SUBTALAR JOINT ARTHRODESIS WITH APPLICATION OF BONE GRAFT  A history and physical examination was performed in my office.  The patient was reexamined.  There have been no changes to this history and physical examination.  Rosetta Posner, DPM

## 2022-05-31 NOTE — Anesthesia Procedure Notes (Signed)
Procedure Name: LMA Insertion Date/Time: 05/31/2022 7:39 AM  Performed by: Genia Del, CRNAPre-anesthesia Checklist: Patient identified, Patient being monitored, Timeout performed, Emergency Drugs available and Suction available Patient Re-evaluated:Patient Re-evaluated prior to induction Oxygen Delivery Method: Circle system utilized Preoxygenation: Pre-oxygenation with 100% oxygen Induction Type: IV induction Ventilation: Mask ventilation without difficulty LMA: LMA inserted LMA Size: 5.0 Tube type: Oral Number of attempts: 1 Placement Confirmation: positive ETCO2 and breath sounds checked- equal and bilateral Tube secured with: Tape Dental Injury: Teeth and Oropharynx as per pre-operative assessment  Comments: Ambu AuraOnce #5LMA used.   Seated well 1rst attempt.

## 2022-05-31 NOTE — Transfer of Care (Signed)
Immediate Anesthesia Transfer of Care Note  Patient: Virginia Webb  Procedure(s) Performed: ARTHRODESIS FOOT (Right: Foot) HARDWARE REMOVAL (Right: Foot)  Patient Location: PACU  Anesthesia Type:General  Level of Consciousness: awake, alert , and oriented  Airway & Oxygen Therapy: Patient Spontanous Breathing and Patient connected to nasal cannula oxygen  Post-op Assessment: Report given to RN and Post -op Vital signs reviewed and stable  Post vital signs: Reviewed and stable See flow sheet .  Last Vitals:  Vitals Value Taken Time  BP 114/69 05/31/22 0948  Temp    Pulse 64 05/31/22 0950  Resp 10 05/31/22 0950  SpO2 98 % 05/31/22 0950  Vitals shown include unvalidated device data.  Last Pain:  Vitals:   05/31/22 0646  TempSrc: Temporal  PainSc: 3          Complications: No notable events documented.

## 2022-05-31 NOTE — Anesthesia Procedure Notes (Signed)
Anesthesia Regional Block: Popliteal block   Pre-Anesthetic Checklist: , timeout performed,  Correct Patient, Correct Site, Correct Laterality,  Correct Procedure, Correct Position, site marked,  Risks and benefits discussed,  Surgical consent,  Pre-op evaluation,  At surgeon's request and post-op pain management  Laterality: Right  Prep: chloraprep       Needles:  Injection technique: Single-shot  Needle Type: Echogenic Needle     Needle Length: 9cm  Needle Gauge: 21     Additional Needles:   Procedures:,,,, ultrasound used (permanent image in chart),,    Narrative:  Start time: 05/31/2022 7:06 AM End time: 05/31/2022 7:22 AM Injection made incrementally with aspirations every 5 mL.  Performed by: Personally  Anesthesiologist: Marisue Humble, MD  Additional Notes: Functioning IV was confirmed and monitors applied. Ultrasound guidance: relevant anatomy identified, needle position confirmed, local anesthetic spread visualized around nerve(s)., vascular puncture avoided.  Image printed for medical record.  Negative aspiration and no paresthesias; incremental administration of local anesthetic. The patient tolerated the procedure well. Vitals signes recorded in RN notes. Also placed adductor canal block but Epic won't let me put both of them and don't know how to configure it to make it show, so am typing this in here for documentation of block

## 2022-06-01 ENCOUNTER — Encounter: Payer: Self-pay | Admitting: Podiatry

## 2023-03-19 ENCOUNTER — Emergency Department
Admission: EM | Admit: 2023-03-19 | Discharge: 2023-03-19 | Disposition: A | Discharge status: Home / Self Care | Attending: Emergency Medicine | Admitting: Emergency Medicine

## 2023-03-19 ENCOUNTER — Other Ambulatory Visit: Payer: Self-pay

## 2023-03-19 ENCOUNTER — Encounter: Payer: Self-pay | Admitting: Emergency Medicine

## 2023-03-19 DIAGNOSIS — D509 Iron deficiency anemia, unspecified: Secondary | ICD-10-CM | POA: Diagnosis not present

## 2023-03-19 DIAGNOSIS — K625 Hemorrhage of anus and rectum: Secondary | ICD-10-CM | POA: Diagnosis not present

## 2023-03-19 DIAGNOSIS — R531 Weakness: Secondary | ICD-10-CM | POA: Diagnosis present

## 2023-03-19 LAB — PREPARE RBC (CROSSMATCH)

## 2023-03-19 LAB — BASIC METABOLIC PANEL
Anion gap: 8 (ref 5–15)
BUN: 24 mg/dL — ABNORMAL HIGH (ref 6–20)
CO2: 27 mmol/L (ref 22–32)
Calcium: 9 mg/dL (ref 8.9–10.3)
Chloride: 102 mmol/L (ref 98–111)
Creatinine, Ser: 1.21 mg/dL — ABNORMAL HIGH (ref 0.44–1.00)
GFR, Estimated: 55 mL/min — ABNORMAL LOW (ref >60)
Glucose, Bld: 92 mg/dL (ref 70–99)
Potassium: 3.3 mmol/L — ABNORMAL LOW (ref 3.5–5.1)
Sodium: 137 mmol/L (ref 135–145)

## 2023-03-19 LAB — CBC
HCT: 27.3 % — ABNORMAL LOW (ref 36.0–46.0)
Hemoglobin: 8.2 g/dL — ABNORMAL LOW (ref 12.0–15.0)
MCH: 20 pg — ABNORMAL LOW (ref 26.0–34.0)
MCHC: 30 g/dL (ref 30.0–36.0)
MCV: 66.7 fL — ABNORMAL LOW (ref 80.0–100.0)
Platelets: 416 10*3/uL — ABNORMAL HIGH (ref 150–400)
RBC: 4.09 MIL/uL (ref 3.87–5.11)
RDW: 16.6 % — ABNORMAL HIGH (ref 11.5–15.5)
WBC: 5.4 10*3/uL (ref 4.0–10.5)
nRBC: 0 % (ref 0.0–0.2)

## 2023-03-19 LAB — URINALYSIS, ROUTINE W REFLEX MICROSCOPIC
Bacteria, UA: NONE SEEN
Bilirubin Urine: NEGATIVE
Glucose, UA: NEGATIVE mg/dL
Ketones, ur: NEGATIVE mg/dL
Nitrite: NEGATIVE
Protein, ur: NEGATIVE mg/dL
Specific Gravity, Urine: 1.025 (ref 1.005–1.030)
pH: 5 (ref 5.0–8.0)

## 2023-03-19 LAB — PROTIME-INR
INR: 1 (ref 0.8–1.2)
Prothrombin Time: 13.3 s (ref 11.4–15.2)

## 2023-03-19 LAB — APTT: aPTT: 26 s (ref 24–36)

## 2023-03-19 MED ORDER — SODIUM CHLORIDE 0.9 % IV SOLN: 10.0000 mL/h | Freq: Once | INTRAVENOUS | Status: DC

## 2023-03-19 NOTE — ED Notes (Signed)
 Pt ambulatory independently with steady gait from triage to room 8

## 2023-03-19 NOTE — ED Triage Notes (Signed)
 Patient to ED via POV for generalized weakness, dizziness, lightheaded. States hx of low iron. Has been taking iron supplements. States she has been having intermittent rectal bleeding x1 month- last time she had rectal bleeding was 4 days ago.

## 2023-03-19 NOTE — ED Provider Notes (Addendum)
 Care assumed of patient from outgoing provider.  See their note for initial history, exam and plan.  Clinical Course as of 03/19/23 1532  Tue Mar 19, 2023  1523 Presents with blood in stool, history of iron def requiring infusions. Hgb stable at 8.  GI consulted and recommended outpatient follow up. 1U PRBC and discharge home.  [SM]    Clinical Course User Index [SM] Corena Herter, MD   Blood completed.  Discharged home with outpatient follow-up. Corena Herter, MD 03/19/23 [id number]    Corena Herter, MD 03/19/23 1745

## 2023-03-19 NOTE — Consult Note (Signed)
 Midge Minium, MD [facility]  [address], [address] [place], [place] [address] Phone: [phone] Fax : [phone]  Consultation  Referring Provider:     Dr. Fanny Bien Primary Care Physician:  Leim Fabry, MD Primary Gastroenterologist: Gentry Fitz         Reason for Consultation:     Iron deficient anemia  Date of Admission:  03/19/2023 Date of Consultation:  03/19/2023         HPI:   Virginia Webb is a 48 y.o. female with a history of bipolar disorder, GERD, iron deficiency anemia and had gastric banding done for weight loss.  The patient reports that she was having generalized weakness with lightheadedness and has a history of rectal bleeding.  She states that she has had iron deficient anemia for as long as she can remember and has not had any workup for this.  She does report that many years ago she thinks she had an upper endoscopy for preoperative workup prior to having her gastric band placed.  She reports that her rectal bleeding was 4 days ago. The patient's hemoglobin on admission was 8.2 with a hematocrit of 27.3 with a MCV of 66. The last blood count I see was back from November 2023 with a hemoglobin of 10.9 with a hematocrit of 35.3.  The patient does report that she takes NSAIDs.  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    Past Surgical History:  Procedure Laterality Date   CESAREAN SECTION  01/31/1995   CESAREAN SECTION  10/22/2000   CYSTOSCOPY N/A 08/13/2016   Procedure: CYSTOSCOPY;  Surgeon:  Schermerhorn, Ihor Austin, MD;  Location: [facility] ORS;  Service: Gynecology;  Laterality: N/A;   FLAT FOOT CORRECTION Right 10/13/2020   Procedure: FLAT FOOT CORRECTION-EVANS OSTEOTOMY, COTTON OSTEOTOMY;  Surgeon: Rosetta Posner, DPM;  Location: [facility];  Service: Podiatry;  Laterality: Right;   FOOT ARTHRODESIS Right 05/31/2022   Procedure: ARTHRODESIS FOOT;  Surgeon: Rosetta Posner, DPM;  Location: [facility];  Service: Podiatry;  Laterality: Right;   GASTROC RECESSION EXTREMITY Right 10/13/2020   Procedure: GASTROC RECESSION;  Surgeon: Rosetta Posner, DPM;  Location: [facility];  Service: Podiatry;  Laterality: Right;  Anesthesia- General, pre op pop/saph block   HARDWARE REMOVAL Right 05/31/2022   Procedure: HARDWARE REMOVAL;  Surgeon: Rosetta Posner, DPM;  Location: [facility];  Service: Podiatry;  Laterality: Right;   Lap Band     LAPAROSCOPIC LYSIS OF ADHESIONS  08/13/2016   Procedure: LAPAROSCOPIC LYSIS OF ADHESIONS;  Surgeon: Schermerhorn, Ihor Austin, MD;  Location: [facility] ORS;  Service: Gynecology;;   LAPAROSCOPIC SUPRACERVICAL HYSTERECTOMY N/A 08/13/2016   Procedure: LAPAROSCOPIC SUPRACERVICAL HYSTERECTOMY;  Surgeon: Suzy Bouchard, MD;  Location: [facility] ORS;  Service: Gynecology;  Laterality: N/A;   LAPAROSCOPIC UNILATERAL SALPINGECTOMY Right 08/13/2016   Procedure: LAPAROSCOPIC UNILATERAL SALPINGECTOMY;  Surgeon: Schermerhorn, Ihor Austin, MD;  Location: [facility] ORS;  Service: Gynecology;  Laterality: Right;   TUBAL LIGATION      Prior to Admission medications   Medication Sig Start Date End Date Taking? Authorizing Provider  Ferrous Sulfate (IRON PO) Take by mouth daily. Patient not taking: Reported on 05/23/2022    [provider]  hydrochlorothiazide (HYDRODIURIL) 25 MG tablet Take 25 mg by mouth daily.    [provider]  Multiple Vitamins-Minerals (MULTIVITAMIN ADULT) CHEW Chew by mouth daily.    [provider]  ondansetron  (ZOFRAN) 4 MG tablet Take 1 tablet (4 mg total) by mouth every 8 (eight) hours as needed for nausea or vomiting. 05/31/22   Rosetta Posner, DPM  promethazine (PHENERGAN) 6.25 MG/5ML syrup Take 20 mLs (25 mg total) by mouth 4 (four) times daily as needed for nausea or vomiting. 08/22/16 10/11/18  Emily Filbert, MD  simethicone (MYLICON) 80 MG chewable tablet Chew 1 tablet (80 mg total) by mouth 4 (four) times daily as needed for flatulence. 08/14/16 10/11/18  Schermerhorn, Ihor Austin, MD    Family History  Problem Relation Age of Onset   Alcohol abuse Father      Social History   Tobacco Use   Smoking status: Every Day    Current packs/day: 0.25    Average packs/day: 0.3 packs/day for 10.0 years (2.5 ttl pk-yrs)    Types: Cigarettes   Smokeless tobacco: Never   Tobacco comments:    Has smoked off and on for last 25 years  Vaping Use   Vaping status: Never Used  Substance Use Topics   Alcohol use: Yes    Alcohol/week: 2.0 standard drinks of alcohol    Types: 2 Glasses of wine per week    Comment: rarely   Drug use: No    Allergies as of 03/19/2023   (No Known Allergies)    Review of Systems:    All systems reviewed and negative except where noted in HPI.   Physical Exam:  Vital signs in last 24 hours: Temp:  [98.2 F (36.8 C)] 98.2 F (36.8 C) (03/18 0919) Pulse Rate:  [57-78] 57 (03/18 1419) Resp:  [17-19] 17 (03/18 1419) BP: (129-145)/(67-93) 145/67 (03/18 1419) SpO2:  [100 %] 100 % (03/18 1419) Weight:  [136.1 kg] 136.1 kg (03/18 0924)   General:   Pleasant, cooperative in NAD Head:  Normocephalic and atraumatic. Eyes:   No icterus.   Conjunctiva pink. PERRLA. Ears:  Normal auditory acuity. Neck:  Supple; no masses or thyroidomegaly Lungs: Respirations even and unlabored. Lungs clear to auscultation bilaterally.   No wheezes, crackles, or rhonchi.  Heart:  Regular rate and rhythm;  Without murmur, clicks, rubs or gallops Abdomen:  Soft, nondistended,  nontender. Normal bowel sounds. No appreciable masses or hepatomegaly.  No rebound or guarding.  Rectal:  Not performed. Msk:  Symmetrical without gross deformities.    Extremities:  Without edema, cyanosis or clubbing. Neurologic:  Alert and oriented x3;  grossly normal neurologically. Skin:  Intact without significant lesions or rashes. Cervical Nodes:  No significant cervical adenopathy. Psych:  Alert and cooperative. Normal affect.  LAB RESULTS: Recent Labs    03/19/23 0925  WBC 5.4  HGB 8.2*  HCT 27.3*  PLT 416*   BMET Recent Labs    03/19/23 0925  NA 137  K 3.3*  CL 102  CO2 27  GLUCOSE 92  BUN 24*  CREATININE 1.21*  CALCIUM 9.0   LFT No results for input(s): "PROT", "ALBUMIN", "AST", "ALT", "ALKPHOS", "BILITOT", "BILIDIR", "IBILI" in  the last 72 hours. PT/INR No results for input(s): "LABPROT", "INR" in the last 72 hours.  STUDIES: No results found.    Impression / Plan:   Assessment: Active Problems:   * No active hospital problems. *   Virginia Webb is a 48 y.o. y/o female with who was admitted with symptomatic anemia.  The patient has a history of iron deficient anemia and states that has been going on for many years.  The patient had a Hemoccult negative stool test back in 2022.  Back at that time the patient had anemia with a low ferritin.  The patient the patient's iron saturation back in November 2023 was 12%.  The patient is now being transfused in the unit and the plan was to send the patient home after transfusion.  The consult is now for the patient's recommendations prior to being discharged.  Plan:  The patient follows up with [facility] primary care and has been told that she needs an EGD and colonoscopy for her iron deficient anemia and also since she has passed the recommended age for screening colonoscopies.  The patient has been told to avoid NSAIDs and to return to the ER if she has any further bleeding or black stools.  She has been told to  either contact her primary care provider by phone and ask for a GI referral to a GI doctor for a EGD and colonoscopy.  If the patient is admitted to the hospital for symptomatic anemia and is not improving then a workup can be done as an inpatient.  The patient has been explained the plan and agrees with it.  Thank you for involving me in the care of this patient.      LOS: 0 days   Midge Minium, MD, MD. Clementeen Graham 03/19/2023, 2:25 PM,  Pager [phone] 7am-5pm  Check AMION for 5pm -7am coverage and on weekends   Note: This dictation was prepared with Dragon dictation along with smaller phrase technology. Any transcriptional errors that result from this process are unintentional.

## 2023-03-19 NOTE — ED Provider Notes (Signed)
 [facility] Provider Note   Event Date/Time   First MD Initiated Contact with Patient 03/19/23 1256     (approximate)  History   Weakness  HPI  Virginia Webb is a 48 y.o. female history of acid reflux, iron deficiency ovarian tumor with hysterectomy  Patient reports that she has many times in the past had to have blood transfusions over the last 20 years due to severely low iron.  She also will occasionally see blood in her stool.  She last saw some blood in her stool about 3 days ago but since then this is cleared up.  No nausea no vomiting no abdominal pain.  She believes that she may need another blood transfusion and used to have iron infusions     Physical Exam   Triage Vital Signs: ED Triage Vitals  Encounter Vitals Group     BP 03/19/23 0919 129/79     Systolic BP Percentile --      Diastolic BP Percentile --      Pulse Rate 03/19/23 0919 78     Resp 03/19/23 0919 18     Temp 03/19/23 0919 98.2 F (36.8 C)     Temp Source 03/19/23 0919 Oral     SpO2 03/19/23 0919 100 %     Weight 03/19/23 0924 300 lb (136.1 kg)     Height 03/19/23 0924 5\' 8"  (1.727 m)     Head Circumference --      Peak Flow --      Pain Score 03/19/23 0924 5     Pain Loc --      Pain Education --      Exclude from Growth Chart --     Most recent vital signs: Vitals:   03/19/23 1248 03/19/23 1419  BP: (!) 138/93 (!) 145/67  Pulse: 68 (!) 57  Resp: 19 17  Temp:    SpO2: 100% 100%     General: Awake, no distress.  CV:  Good peripheral perfusion.  Resp:  Normal effort.  Abd:  No distention.  Abdomen soft nontender nondistended throughout. Other:  Skin is warm well-perfused   ED Results / Procedures / Treatments   Labs (all labs ordered are listed, but only abnormal results are displayed) Labs Reviewed  BASIC METABOLIC PANEL - Abnormal; Notable for the following components:      Result Value   Potassium 3.3 (*)    BUN 24 (*)    Creatinine, Ser 1.21  (*)    GFR, Estimated 55 (*)    All other components within normal limits  CBC - Abnormal; Notable for the following components:   Hemoglobin 8.2 (*)    HCT 27.3 (*)    MCV 66.7 (*)    MCH 20.0 (*)    RDW 16.6 (*)    Platelets 416 (*)    All other components within normal limits  URINALYSIS, ROUTINE W REFLEX MICROSCOPIC - Abnormal; Notable for the following components:   Color, Urine YELLOW (*)    APPearance HAZY (*)    Hgb urine dipstick SMALL (*)    Leukocytes,Ua TRACE (*)    All other components within normal limits  APTT  PROTIME-INR  CBG MONITORING, ED  PREPARE RBC (CROSSMATCH)  TYPE AND SCREEN   Labs notable for significant anemia hemoglobin 8.2, slight downtrend from her previous labs at [facility] which are over a year ago.  Platelet count is appropriate she denies use of any blood thinners or anticoagulants at  this time.  She does use fairly significant mounts of ibuprofen on a daily basis which she will discontinue.  No symptoms of urinary tract infection slightly contaminated sample no evidence of UTI.  Symptoms of UTI  EKG     RADIOLOGY  No indication for abdominal imaging.  No abdominal discomfort no nausea vomiting no fevers or chills.   PROCEDURES:  Critical Care performed: No  Procedures   MEDICATIONS ORDERED IN ED: Medications  0.9 %  sodium chloride infusion (has no administration in time range)     IMPRESSION / MDM / ASSESSMENT AND PLAN / ED COURSE  I reviewed the triage vital signs and the nursing notes.                              Differential diagnosis includes, but is not limited to, possible lower GI bleeding, chronic anemia, NSAID use, peptic ulcer upper GI bleeding causes also considered though seems far less likely given the history she reports.  Her labs and previous evaluations have suggested significant iron deficiency.  She has microcytic anemia today.  Patient is awake alert well-oriented with stable hemodynamics.  She does not  appear to need an acute admission, but I do believe she would benefit from transfusion.  Patient consents verbally and also agreeable to sign consent after discussing risks benefits and alternatives to blood transfusion.  I will consult gastroenterology  Patient's presentation is most consistent with acute complicated illness / injury requiring diagnostic workup.   Not actually certain that she has acute blood loss anemia at this point, rather it sounds more like she may have a microcytic chronic anemia from iron deficiency though further evaluation such as colonoscopy is recommended.  I did discuss this with the patient and she is agreeable to close follow-up with gastroenterology and [facility] primary.  She is hemodynamically stable and reports seeing no further blood in her stool for at least the last 3 days   Clinical Course as of 03/19/23 1525  Tue Mar 19, 2023  1523 Presents with blood in stool, history of iron def requiring infusions. Hgb stable at 8.  GI consulted and recommended outpatient follow up. 1U PRBC and discharge home.  [SM]    Clinical Course User Index [SM] Corena Herter, MD   ----------------------------------------- 2:44 PM on 03/19/2023 ----------------------------------------- Appreciate consult by gastroenterology.  Recommendations noted as "The patient follows up with [facility] primary care and has been told that she needs an EGD and colonoscopy for her iron deficient anemia and also since she has passed the recommended age for screening colonoscopies. The patient has been told to avoid NSAIDs and to return to the ER if she has any further bleeding or black stools. She has been told to either contact her primary care provider by phone and ask for a GI referral to a GI doctor for a EGD and colonoscopy. If the patient is admitted to the hospital for symptomatic anemia and is not improving then a workup can be done as an inpatient. The patient has been explained the plan and agrees  with it. "  Plan of care assigned to oncoming physician Dr. Arnoldo Morale, With plan for reassessment after 1 unit of red cell transfusion.  If patient feeling well vital signs stable and patient comfortable with plan would recommend discharge with plan for follow-up with [facility] gastroenterology as well as her primary care as recommended by GI.    FINAL CLINICAL IMPRESSION(S) /  ED DIAGNOSES   Final diagnoses:  Iron deficiency anemia, unspecified iron deficiency anemia type  Rectal bleeding     Rx / DC Orders   ED Discharge Orders          Ordered    Ambulatory referral to Gastroenterology        03/19/23 1340             Note:  This document was prepared using Dragon voice recognition software and may include unintentional dictation errors.   Sharyn Creamer, MD 03/19/23 [phone]

## 2023-03-20 LAB — BPAM RBC
Blood Product Expiration Date: 202503242359
ISSUE DATE / TIME: 202503181533
Unit Type and Rh: 6200

## 2023-03-20 LAB — TYPE AND SCREEN
ABO/RH(D): AB POS
Antibody Screen: NEGATIVE
Unit division: 0

## 2023-05-29 ENCOUNTER — Other Ambulatory Visit: Payer: Self-pay

## 2023-05-29 ENCOUNTER — Emergency Department: Admission: EM | Admit: 2023-05-29 | Discharge: 2023-05-29 | Disposition: A | Discharge status: Home / Self Care

## 2023-05-29 DIAGNOSIS — E876 Hypokalemia: Secondary | ICD-10-CM | POA: Insufficient documentation

## 2023-05-29 DIAGNOSIS — I10 Essential (primary) hypertension: Secondary | ICD-10-CM | POA: Insufficient documentation

## 2023-05-29 DIAGNOSIS — R5383 Other fatigue: Secondary | ICD-10-CM | POA: Diagnosis present

## 2023-05-29 DIAGNOSIS — D509 Iron deficiency anemia, unspecified: Secondary | ICD-10-CM | POA: Diagnosis not present

## 2023-05-29 LAB — CBC WITH DIFFERENTIAL/PLATELET
Abs Immature Granulocytes: 0.02 10*3/uL (ref 0.00–0.07)
Basophils Absolute: 0 10*3/uL (ref 0.0–0.1)
Basophils Relative: 1 %
Eosinophils Absolute: 0.2 10*3/uL (ref 0.0–0.5)
Eosinophils Relative: 3 %
HCT: 31 % — ABNORMAL LOW (ref 36.0–46.0)
Hemoglobin: 9.3 g/dL — ABNORMAL LOW (ref 12.0–15.0)
Immature Granulocytes: 0 %
Lymphocytes Relative: 20 %
Lymphs Abs: 1.3 10*3/uL (ref 0.7–4.0)
MCH: 19.7 pg — ABNORMAL LOW (ref 26.0–34.0)
MCHC: 30 g/dL (ref 30.0–36.0)
MCV: 65.8 fL — ABNORMAL LOW (ref 80.0–100.0)
Monocytes Absolute: 0.4 10*3/uL (ref 0.1–1.0)
Monocytes Relative: 6 %
Neutro Abs: 4.3 10*3/uL (ref 1.7–7.7)
Neutrophils Relative %: 70 %
Platelets: 306 10*3/uL (ref 150–400)
RBC: 4.71 MIL/uL (ref 3.87–5.11)
RDW: 18.6 % — ABNORMAL HIGH (ref 11.5–15.5)
WBC: 6.2 10*3/uL (ref 4.0–10.5)
nRBC: 0 % (ref 0.0–0.2)

## 2023-05-29 LAB — BASIC METABOLIC PANEL WITH GFR
Anion gap: 10 (ref 5–15)
BUN: 22 mg/dL — ABNORMAL HIGH (ref 6–20)
CO2: 27 mmol/L (ref 22–32)
Calcium: 9.2 mg/dL (ref 8.9–10.3)
Chloride: 100 mmol/L (ref 98–111)
Creatinine, Ser: 1.03 mg/dL — ABNORMAL HIGH (ref 0.44–1.00)
GFR, Estimated: >60 mL/min (ref >60)
Glucose, Bld: 96 mg/dL (ref 70–99)
Potassium: 3.1 mmol/L — ABNORMAL LOW (ref 3.5–5.1)
Sodium: 137 mmol/L (ref 135–145)

## 2023-05-29 LAB — TYPE AND SCREEN
ABO/RH(D): AB POS
Antibody Screen: NEGATIVE

## 2023-05-29 LAB — IRON AND TIBC
Iron: 25 ug/dL — ABNORMAL LOW (ref 28–170)
Saturation Ratios: 6 % — ABNORMAL LOW (ref 10.4–31.8)
TIBC: 444 ug/dL (ref 250–450)
UIBC: 419 ug/dL

## 2023-05-29 LAB — FERRITIN: Ferritin: 5 ng/mL — ABNORMAL LOW (ref 11–307)

## 2023-05-29 MED ORDER — POTASSIUM CHLORIDE 20 MEQ PO PACK
40.0000 meq | PACK | Freq: Two times a day (BID) | ORAL | Status: DC
  Administered 2023-05-29: 40 meq via ORAL
  Filled 2023-05-29: qty 2

## 2023-05-29 NOTE — ED Notes (Signed)
 MD at bedside.

## 2023-05-29 NOTE — ED Triage Notes (Signed)
 Pt reports hx anemia, pt states a few weeks ago her iron was low and now she feels like it is even older. Pt reports bilateral arm and leg pain.

## 2023-05-29 NOTE — ED Notes (Signed)
 Pt verbalizes understanding of discharge instructions. Opportunity for questioning and answers were provided. Pt discharged from ED to home.   ? ?

## 2023-05-29 NOTE — ED Provider Notes (Signed)
 [facility] Provider Note    Event Date/Time   First MD Initiated Contact with Patient 05/29/23 0700     (approximate)   History   Abnormal Lab  Pt reports hx anemia, pt states a few weeks ago her iron was low and now she feels like it is even older. Pt reports bilateral arm and leg pain.    HPI Virginia Webb is a 48 y.o. female  pmh iron deficiency anemia, prior CVA, bipolar disorder, menorrhagia, htn, hld p/w generalized fatigue - Ongoing over the past several days to weeks, feels like prior times when she has had low blood levels - Has a hematology appointment on 06/03/2023 and will also be seeing a gastroenterologist in June - Is taking her iron supplementation as already prescribed - No black or bloody stools, no vaginal bleeding, no hematuria, emesis, hemoptysis - Denies chest pain, shortness of breath, lightheadedness   Per chart review, patient was last seen in clinic on 05/10/2023 for intermittent rectal bleeding and anemia.  On iron supplementation.  Has referral for GI specialist.         Physical Exam   Triage Vital Signs: ED Triage Vitals  Encounter Vitals Group     BP 05/29/23 0646 (!) 144/96     Systolic BP Percentile --      Diastolic BP Percentile --      Pulse Rate 05/29/23 0646 70     Resp 05/29/23 0646 18     Temp 05/29/23 0646 98.5 F (36.9 C)     Temp Source 05/29/23 0646 Oral     SpO2 05/29/23 0646 95 %     Weight 05/29/23 0646 270 lb (122.5 kg)     Height 05/29/23 0646 5\' 9"  (1.753 m)     Head Circumference --      Peak Flow --      Pain Score 05/29/23 0645 10     Pain Loc --      Pain Education --      Exclude from Growth Chart --     Most recent vital signs: Vitals:   05/29/23 0900 05/29/23 0930  BP: 123/85 (!) 138/98  Pulse: 62 66  Resp: 16 (!) 25  Temp:    SpO2: 99% 100%     General: Awake, no distress.  CV:  Good peripheral perfusion. RRR, RP 2+ Resp:  Normal effort. CTAB Abd:  No distention.  Nontender to deep palpation throughout   ED Results / Procedures / Treatments   Labs (all labs ordered are listed, but only abnormal results are displayed) Labs Reviewed  CBC WITH DIFFERENTIAL/PLATELET - Abnormal; Notable for the following components:      Result Value   Hemoglobin 9.3 (*)    HCT 31.0 (*)    MCV 65.8 (*)    MCH 19.7 (*)    RDW 18.6 (*)    All other components within normal limits  BASIC METABOLIC PANEL WITH GFR - Abnormal; Notable for the following components:   Potassium 3.1 (*)    BUN 22 (*)    Creatinine, Ser 1.03 (*)    All other components within normal limits  IRON AND TIBC - Abnormal; Notable for the following components:   Iron 25 (*)    Saturation Ratios 6 (*)    All other components within normal limits  FERRITIN - Abnormal; Notable for the following components:   Ferritin 5 (*)    All other components within normal limits  TYPE  AND SCREEN     EKG  N/a   RADIOLOGY N/a    PROCEDURES:  Critical Care performed: No  Procedures   MEDICATIONS ORDERED IN ED: Medications  potassium chloride  (KLOR-CON ) packet 40 mEq (40 mEq Oral Given 05/29/23 0809)     IMPRESSION / MDM / ASSESSMENT AND PLAN / ED COURSE  I reviewed the triage vital signs and the nursing notes.                              DDX/MDM/AP: Differential diagnosis includes, but is not limited to, worsening anemia, suspect secondary to underlying iron deficiency and possibly contributing thalassemia.  No evidence of acute GI bleeding or other dangerous bleeding source.  Consider electrolyte abnormality.  Plan: - Labs - Consider transfusion as needed - Already has appropriate outpatient follow-up in place  Patient's presentation is most consistent with acute presentation with potential threat to life or bodily function.   ED course below.  CBC with improved anemia, hemoglobin greater than 9, no indication for transfusion.  Is already on iron, counseled to continue.  Does  have hematology follow-up on 06/03/2023-can discuss iron regimen further at that time and consider possibility of outpatient transfusions.  Already has referral and plan to see GI this coming month as well.  No evidence of bleeding at this time.  ED return precautions in place.  Patient agrees with plan.  Clinical Course as of 05/29/23 1013  Wed May 29, 2023  0746 Anemia notably improved from prior, not at threshold for transfusion [MM]  0746 BMP with hypokalemia, will replete p.o., may be contributing to some of her sensation of peripheral weakness/soreness diffusely [MM]    Clinical Course User Index [MM] Collis Deaner, MD     FINAL CLINICAL IMPRESSION(S) / ED DIAGNOSES   Final diagnoses:  Iron deficiency anemia, unspecified iron deficiency anemia type     Rx / DC Orders   ED Discharge Orders     None        Note:  This document was prepared using Dragon voice recognition software and may include unintentional dictation errors.   Collis Deaner, MD 05/29/23 1013

## 2023-05-29 NOTE — Discharge Instructions (Signed)
 Your evaluation in the emergency department was overall reassuring.  As discussed, you do have a low iron level, I recommend you follow-up with your hematologist next week as already planned.  Continue to take your iron supplements in the meantime.  Please also follow-up with a gastroenterologist as already planned.  Your blood level was improved today and you do not require transfusion.  Return to the emergency department with any new or worsening symptoms.

## 2023-06-03 ENCOUNTER — Inpatient Hospital Stay

## 2023-06-03 ENCOUNTER — Encounter: Payer: Self-pay | Admitting: Internal Medicine

## 2023-06-03 ENCOUNTER — Inpatient Hospital Stay: Attending: Internal Medicine | Admitting: Internal Medicine

## 2023-06-03 VITALS — BP 125/79 | HR 91 | Temp 97.8°F | Resp 18 | Ht 69.0 in | Wt 290.0 lb

## 2023-06-03 DIAGNOSIS — D649 Anemia, unspecified: Secondary | ICD-10-CM

## 2023-06-03 DIAGNOSIS — R5383 Other fatigue: Secondary | ICD-10-CM

## 2023-06-03 DIAGNOSIS — D563 Thalassemia minor: Secondary | ICD-10-CM | POA: Diagnosis not present

## 2023-06-03 DIAGNOSIS — E559 Vitamin D deficiency, unspecified: Secondary | ICD-10-CM

## 2023-06-03 DIAGNOSIS — E538 Deficiency of other specified B group vitamins: Secondary | ICD-10-CM

## 2023-06-03 DIAGNOSIS — N920 Excessive and frequent menstruation with regular cycle: Secondary | ICD-10-CM | POA: Diagnosis not present

## 2023-06-03 DIAGNOSIS — D509 Iron deficiency anemia, unspecified: Secondary | ICD-10-CM

## 2023-06-03 DIAGNOSIS — R0602 Shortness of breath: Secondary | ICD-10-CM | POA: Diagnosis not present

## 2023-06-03 NOTE — Assessment & Plan Note (Addendum)
#  Symptomatic  Anemia/thalassemia trait Hb 9; ferritin [May 2025]-symptomatic.  Likely due to iron deficiency - from etiology/malabsorption-gastric lap band.    # Poor tolerance/lack of improvement on oral iron.  Discussed regarding IV iron infusion/Venofer. Discussed the potential acute infusion reactions with IV iron; which are quite rare.  Patient understands the risk; will proceed with infusions.  Patient had previous infusions without any side effects.  #Etiology of iron deficiency: 2017- hx of IDA sec to menorrhagia [s/p TAH- 2020]-lapband- [2009- [place]]; I had a long discussion with the patient regarding multiple etiologies of anemia including iron deficiency-which is mainly caused by [employer].    # Hx of B12 def-continue B12 supplementation OTC.  # vit D def -5000 international units- a day-   Thank you  Ms.Franklin PA-C  for allowing me to participate in the care of your pleasant patient. Please do not hesitate to contact me with questions or concerns in the interim.  TAH- No pregnancy tests  # DISPOSITION: # NO  labs today # weekly venofer x 4 - start ASAP -if possible this week # follow up 2 months- MD; labs- cbc/bmp; b12; LDH; haptoglobin, retic count; vit D 25-OH levels- -  possible venofer;  Dr.B

## 2023-06-03 NOTE — Progress Notes (Signed)
 Patient was in the hospital and started back on the iron pills, but she has done that before, she has had the iron infusions before and she feels like that is what would help her feel like she can actually be able to go to work.  Her chest has been hurting every now and then but the recent ecg told them everything was fine. She is also having some pain in her hands and in her feet.

## 2023-06-03 NOTE — Progress Notes (Signed)
 [facility] CONSULT NOTE  Patient Care Team: Madaline Scales, PA-C as PCP - General (Family Medicine) Gwyn Leos, MD as Consulting Physician (Oncology) Gwyn Leos, MD as Consulting Physician (Hematology and Oncology)  CHIEF COMPLAINTS/PURPOSE OF CONSULTATION: ANEMIA   HEMATOLOGY HISTORY  # ANEMIA[Hb; MCV-platelets- WBC; Iron sat; ferritin;  GFR- CT/US - ;    Latest Reference Range & Units 05/29/23 07:12  Iron 28 - 170 ug/dL 25 (L)  UIBC ug/dL 295  TIBC 621 - 308 ug/dL 657  Saturation Ratios 10.4 - 31.8 % 6 (L)  Ferritin 11 - 307 ng/mL 5 (L)  WBC 4.0 - 10.5 K/uL 6.2  RBC 3.87 - 5.11 MIL/uL 4.71  Hemoglobin 12.0 - 15.0 g/dL 9.3 (L)  HCT 84.6 - 96.2 % 31.0 (L)  MCV 80.0 - 100.0 fL 65.8 (L)  MCH 26.0 - 34.0 pg 19.7 (L)  MCHC 30.0 - 36.0 g/dL 95.2  RDW 84.1 - 32.4 % 18.6 (H)  Platelets 150 - 400 K/uL 306  nRBC 0.0 - 0.2 % 0.0  (L): Data is abnormally low (H): Data is abnormally high  HISTORY OF PRESENTING ILLNESS: Patient ambulating-independently.  Alone   Virginia Webb 48 y.o.  female pleasant patient with a history of gastric lap band 2009 and history of menorrhagia status post hysterectomy 2020-history of thalassemia trait been referred to us  for further evaluation of worsening iron deficiency/anemia.  Patient reportedly had iron infusions in 2017 in [place] improved after hysterectomy in 2020.  However more recently she continued to be more symptomatic with worsening shortness of breath especially exertion extreme fatigue.  Of note patient was recently evaluated in the emergency room-noted to have 1 episode of blood in stool current resolved.  Currently awaiting GI evaluation.   Blood in stools:one episode with BM- EGD/colonoscopy-[none]- awaiting evaluation next week Blood in urine: none Prior blood transfusion: None Kidney/Liver disease: none Alcohol: none Bariatric surgery:2009; lap band-    Vaginal bleeding: none  TAH- 2020.    Review of Systems  Constitutional:  Positive for malaise/fatigue. Negative for chills, diaphoresis, fever and weight loss.  HENT:  Negative for nosebleeds and sore throat.   Eyes:  Negative for double vision.  Respiratory:  Positive for shortness of breath. Negative for cough, hemoptysis, sputum production and wheezing.   Cardiovascular:  Negative for chest pain, palpitations, orthopnea and leg swelling.  Gastrointestinal:  Negative for abdominal pain, blood in stool, constipation, diarrhea, heartburn, melena, nausea and vomiting.  Genitourinary:  Negative for dysuria, frequency and urgency.  Musculoskeletal:  Negative for back pain and joint pain.  Skin: Negative.  Negative for itching and rash.  Neurological:  Negative for dizziness, tingling, focal weakness, weakness and headaches.  Endo/Heme/Allergies:  Does not bruise/bleed easily.  Psychiatric/Behavioral:  Negative for depression. The patient is not nervous/anxious and does not have insomnia.      MEDICAL HISTORY:  Past Medical History:  Diagnosis Date   Anxiety and depression    Arthritis    right foot   Bipolar affective disorder (HCC)    Fatigue    GERD (gastroesophageal reflux disease)    Hyperlipidemia    Hypertension    Iron deficiency anemia    Iron deficiency anemia, unspecified 06/06/2012   Lung nodules    Menorrhagia    Ovarian tumor    Post surgical complication 2002   on life support for 5 weeks after post c-section complication   Seasonal allergies    Stroke (HCC)    Lt side affected  SURGICAL HISTORY: Past Surgical History:  Procedure Laterality Date   CESAREAN SECTION  01/31/1995   CESAREAN SECTION  10/22/2000   CYSTOSCOPY N/A 08/13/2016   Procedure: CYSTOSCOPY;  Surgeon: Schermerhorn, Joselyn Nicely, MD;  Location: [facility];  Service: Gynecology;  Laterality: N/A;   FLAT FOOT CORRECTION Right 10/13/2020   Procedure: FLAT FOOT CORRECTION-EVANS OSTEOTOMY, COTTON OSTEOTOMY;  Surgeon: Pink Bridges, DPM;  Location: [facility];  Service: Podiatry;  Laterality: Right;   FOOT ARTHRODESIS Right 05/31/2022   Procedure: ARTHRODESIS FOOT;  Surgeon: Pink Bridges, DPM;  Location: [facility];  Service: Podiatry;  Laterality: Right;   GASTROC RECESSION EXTREMITY Right 10/13/2020   Procedure: GASTROC RECESSION;  Surgeon: Pink Bridges, DPM;  Location: [facility];  Service: Podiatry;  Laterality: Right;  Anesthesia- General, pre op pop/saph block   HARDWARE REMOVAL Right 05/31/2022   Procedure: HARDWARE REMOVAL;  Surgeon: Pink Bridges, DPM;  Location: [facility];  Service: Podiatry;  Laterality: Right;   Lap Band     LAPAROSCOPIC LYSIS OF ADHESIONS  08/13/2016   Procedure: LAPAROSCOPIC LYSIS OF ADHESIONS;  Surgeon: Schermerhorn, Joselyn Nicely, MD;  Location: [facility];  Service: Gynecology;;   LAPAROSCOPIC SUPRACERVICAL HYSTERECTOMY N/A 08/13/2016   Procedure: LAPAROSCOPIC SUPRACERVICAL HYSTERECTOMY;  Surgeon: Carolynn Citrin, MD;  Location: [facility];  Service: Gynecology;  Laterality: N/A;   LAPAROSCOPIC UNILATERAL SALPINGECTOMY Right 08/13/2016   Procedure: LAPAROSCOPIC UNILATERAL SALPINGECTOMY;  Surgeon: Schermerhorn, Joselyn Nicely, MD;  Location: [facility];  Service: Gynecology;  Laterality: Right;   TUBAL LIGATION      SOCIAL HISTORY: Social History   Socioeconomic History   Marital status: Married    Spouse name: Not on file   Number of children: 2   Years of education: Not on file   Highest education level: Not on file  Occupational History    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 EMPLOYED    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  Tobacco Use   Smoking status: Every Day    Current packs/day: 0.25    Average packs/day: 0.3 packs/day for 10.0 years (2.5 ttl pk-yrs)    Types: Cigarettes   Smokeless tobacco: Never   Tobacco comments:    Has smoked off and on for last 25 years  Vaping Use   Vaping status: Never Used  Substance and Sexual Activity   Alcohol use: Yes    Alcohol/week: 2.0  standard drinks of alcohol    Types: 2 Glasses of wine per week    Comment: rarely   Drug use: No   Sexual activity: Yes    Birth control/protection: None  Other Topics Concern   Not on file  Social History Narrative   Not on file   Social Drivers of Health   Financial Resource Strain: Not on file  Food Insecurity: Not on file  Transportation Needs: Not on file  Physical Activity: Not on file  Stress: Not on file  Social Connections: Not on file  Intimate Partner Violence: Not on file    FAMILY HISTORY: Family History  Problem Relation Age of Onset   Alcohol abuse Father     ALLERGIES:  has no known allergies.  MEDICATIONS:  Current Outpatient Medications  Medication Sig Dispense Refill   Ferrous Sulfate (IRON PO) Take by mouth daily.     hydrochlorothiazide (HYDRODIURIL) 25 MG tablet Take 25 mg by mouth daily.     Multiple Vitamins-Minerals (MULTIVITAMIN ADULT) CHEW Chew by mouth daily.     ondansetron  (ZOFRAN ) 4 MG tablet Take 1 tablet (4 mg  total) by mouth every 8 (eight) hours as needed for nausea or vomiting. 20 tablet 0   No current facility-administered medications for this visit.     PHYSICAL EXAMINATION:   Vitals:   06/03/23 1030  BP: 125/79  Pulse: 91  Resp: 18  Temp: 97.8 F (36.6 C)  SpO2: 99%   Filed Weights   06/03/23 1030  Weight: 290 lb (131.5 kg)    Physical Exam Vitals and nursing note reviewed.  HENT:     Head: Normocephalic and atraumatic.     Mouth/Throat:     Pharynx: Oropharynx is clear.  Eyes:     Extraocular Movements: Extraocular movements intact.     Pupils: Pupils are equal, round, and reactive to light.  Cardiovascular:     Rate and Rhythm: Normal rate and regular rhythm.  Pulmonary:     Comments: Decreased breath sounds bilaterally.  Abdominal:     Palpations: Abdomen is soft.  Musculoskeletal:        General: Normal range of motion.     Cervical back: Normal range of motion.  Skin:    General: Skin is warm.   Neurological:     General: No focal deficit present.     Mental Status: She is alert and oriented to person, place, and time.  Psychiatric:        Behavior: Behavior normal.        Judgment: Judgment normal.      LABORATORY DATA:  I have reviewed the data as listed Lab Results  Component Value Date   WBC 6.2 05/29/2023   HGB 9.3 (L) 05/29/2023   HCT 31.0 (L) 05/29/2023   MCV 65.8 (L) 05/29/2023   PLT 306 05/29/2023   Recent Labs    03/19/23 0925 05/29/23 0712  NA 137 137  K 3.3* 3.1*  CL 102 100  CO2 27 27  GLUCOSE 92 96  BUN 24* 22*  CREATININE 1.21* 1.03*  CALCIUM 9.0 9.2  GFRNONAA 55* >60     No results found.  ASSESSMENT & PLAN:   Symptomatic anemia #Symptomatic  Anemia/thalassemia trait Hb 9; ferritin [May 2025]-symptomatic.  Likely due to iron deficiency - from etiology/malabsorption-gastric lap band.    # Poor tolerance/lack of improvement on oral iron.  Discussed regarding IV iron infusion/Venofer. Discussed the potential acute infusion reactions with IV iron; which are quite rare.  Patient understands the risk; will proceed with infusions.  Patient had previous infusions without any side effects.  #Etiology of iron deficiency: 2017- hx of IDA sec to menorrhagia [s/p TAH- 2020]-lapband- [2009- [place]]; I had a long discussion with the patient regarding multiple etiologies of anemia including iron deficiency-which is mainly caused by [employer].    # Hx of B12 def-continue B12 supplementation OTC.  # vit D def -5000 international units- a day-   Thank you  Ms.Franklin PA-C  for allowing me to participate in the care of your pleasant patient. Please do not hesitate to contact me with questions or concerns in the interim.  TAH- No pregnancy tests  # DISPOSITION: # NO  labs today # weekly venofer x 4 - start ASAP -if possible this week # follow up 2 months- MD; labs- cbc/bmp; b12; LDH; haptoglobin, retic count; vit D 25-OH levels- -  possible venofer;   Dr.B    All questions were answered. The patient knows to call the clinic with any problems, questions or concerns.    Gwyn Leos, MD 06/03/2023 12:08 PM

## 2023-06-04 ENCOUNTER — Inpatient Hospital Stay

## 2023-06-04 VITALS — BP 141/94 | HR 73 | Temp 98.3°F | Resp 18

## 2023-06-04 DIAGNOSIS — D649 Anemia, unspecified: Secondary | ICD-10-CM

## 2023-06-04 DIAGNOSIS — D509 Iron deficiency anemia, unspecified: Secondary | ICD-10-CM | POA: Diagnosis not present

## 2023-06-04 MED ORDER — IRON SUCROSE 20 MG/ML IV SOLN
200.0000 mg | Freq: Once | INTRAVENOUS | Status: AC
  Administered 2023-06-04: 200 mg via INTRAVENOUS

## 2023-06-04 MED ORDER — SODIUM CHLORIDE 0.9% FLUSH
10.0000 mL | Freq: Once | INTRAVENOUS | Status: AC | PRN
  Administered 2023-06-04: 10 mL
  Filled 2023-06-04: qty 10

## 2023-06-11 ENCOUNTER — Inpatient Hospital Stay

## 2023-06-11 VITALS — BP 132/85 | HR 63 | Temp 97.1°F | Resp 18

## 2023-06-11 DIAGNOSIS — D649 Anemia, unspecified: Secondary | ICD-10-CM

## 2023-06-11 DIAGNOSIS — D509 Iron deficiency anemia, unspecified: Secondary | ICD-10-CM | POA: Diagnosis not present

## 2023-06-11 MED ORDER — IRON SUCROSE 20 MG/ML IV SOLN
200.0000 mg | Freq: Once | INTRAVENOUS | Status: AC
  Administered 2023-06-11: 200 mg via INTRAVENOUS

## 2023-06-11 MED ORDER — SODIUM CHLORIDE 0.9% FLUSH
10.0000 mL | Freq: Once | INTRAVENOUS | Status: AC | PRN
  Administered 2023-06-11: 10 mL
  Filled 2023-06-11: qty 10

## 2023-06-18 ENCOUNTER — Inpatient Hospital Stay

## 2023-06-18 VITALS — BP 130/85 | HR 71 | Temp 97.3°F | Resp 18

## 2023-06-18 DIAGNOSIS — D509 Iron deficiency anemia, unspecified: Secondary | ICD-10-CM | POA: Diagnosis not present

## 2023-06-18 DIAGNOSIS — D649 Anemia, unspecified: Secondary | ICD-10-CM

## 2023-06-18 MED ORDER — SODIUM CHLORIDE 0.9% FLUSH
10.0000 mL | Freq: Once | INTRAVENOUS | Status: AC | PRN
  Administered 2023-06-18: 10 mL
  Filled 2023-06-18: qty 10

## 2023-06-18 MED ORDER — IRON SUCROSE 20 MG/ML IV SOLN
200.0000 mg | Freq: Once | INTRAVENOUS | Status: AC
  Administered 2023-06-18: 200 mg via INTRAVENOUS

## 2023-06-24 ENCOUNTER — Other Ambulatory Visit: Payer: Self-pay | Admitting: Family Medicine

## 2023-06-24 DIAGNOSIS — Z1231 Encounter for screening mammogram for malignant neoplasm of breast: Secondary | ICD-10-CM

## 2023-06-25 ENCOUNTER — Inpatient Hospital Stay

## 2023-06-25 VITALS — BP 146/86 | HR 82 | Temp 97.8°F | Resp 18

## 2023-06-25 DIAGNOSIS — D649 Anemia, unspecified: Secondary | ICD-10-CM

## 2023-06-25 DIAGNOSIS — D509 Iron deficiency anemia, unspecified: Secondary | ICD-10-CM | POA: Diagnosis not present

## 2023-06-25 MED ORDER — IRON SUCROSE 20 MG/ML IV SOLN
200.0000 mg | Freq: Once | INTRAVENOUS | Status: AC
  Administered 2023-06-25: 200 mg via INTRAVENOUS

## 2023-06-25 NOTE — Patient Instructions (Signed)

## 2023-08-06 ENCOUNTER — Inpatient Hospital Stay

## 2023-08-06 ENCOUNTER — Encounter: Payer: Self-pay | Admitting: Internal Medicine

## 2023-08-06 ENCOUNTER — Inpatient Hospital Stay: Attending: Internal Medicine

## 2023-08-06 ENCOUNTER — Inpatient Hospital Stay: Admitting: Internal Medicine

## 2023-08-06 VITALS — BP 119/88 | HR 59

## 2023-08-06 VITALS — BP 130/76 | HR 60 | Temp 98.3°F | Resp 16 | Ht 69.0 in | Wt 290.0 lb

## 2023-08-06 DIAGNOSIS — E538 Deficiency of other specified B group vitamins: Secondary | ICD-10-CM | POA: Diagnosis not present

## 2023-08-06 DIAGNOSIS — D649 Anemia, unspecified: Secondary | ICD-10-CM

## 2023-08-06 DIAGNOSIS — D509 Iron deficiency anemia, unspecified: Secondary | ICD-10-CM | POA: Diagnosis present

## 2023-08-06 DIAGNOSIS — E559 Vitamin D deficiency, unspecified: Secondary | ICD-10-CM | POA: Insufficient documentation

## 2023-08-06 DIAGNOSIS — D563 Thalassemia minor: Secondary | ICD-10-CM | POA: Diagnosis not present

## 2023-08-06 LAB — CMP (CANCER CENTER ONLY)
ALT: 15 U/L (ref 0–44)
AST: 15 U/L (ref 15–41)
Albumin: 4 g/dL (ref 3.5–5.0)
Alkaline Phosphatase: 71 U/L (ref 38–126)
Anion gap: 11 (ref 5–15)
BUN: 23 mg/dL — ABNORMAL HIGH (ref 6–20)
CO2: 30 mmol/L (ref 22–32)
Calcium: 9.5 mg/dL (ref 8.9–10.3)
Chloride: 98 mmol/L (ref 98–111)
Creatinine: 1.05 mg/dL — ABNORMAL HIGH (ref 0.44–1.00)
GFR, Estimated: >60 mL/min (ref >60)
Glucose, Bld: 92 mg/dL (ref 70–99)
Potassium: 3.4 mmol/L — ABNORMAL LOW (ref 3.5–5.1)
Sodium: 139 mmol/L (ref 135–145)
Total Bilirubin: 0.6 mg/dL (ref 0.0–1.2)
Total Protein: 7.8 g/dL (ref 6.5–8.1)

## 2023-08-06 LAB — CBC WITH DIFFERENTIAL (CANCER CENTER ONLY)
Abs Immature Granulocytes: 0.02 K/uL (ref 0.00–0.07)
Basophils Absolute: 0.1 K/uL (ref 0.0–0.1)
Basophils Relative: 1 %
Eosinophils Absolute: 0 K/uL (ref 0.0–0.5)
Eosinophils Relative: 0 %
HCT: 40.5 % (ref 36.0–46.0)
Hemoglobin: 12.7 g/dL (ref 12.0–15.0)
Immature Granulocytes: 0 %
Lymphocytes Relative: 22 %
Lymphs Abs: 1.6 K/uL (ref 0.7–4.0)
MCH: 21.3 pg — ABNORMAL LOW (ref 26.0–34.0)
MCHC: 31.4 g/dL (ref 30.0–36.0)
MCV: 68.1 fL — ABNORMAL LOW (ref 80.0–100.0)
Monocytes Absolute: 0.4 K/uL (ref 0.1–1.0)
Monocytes Relative: 5 %
Neutro Abs: 5.1 K/uL (ref 1.7–7.7)
Neutrophils Relative %: 72 %
Platelet Count: 334 K/uL (ref 150–400)
RBC: 5.95 MIL/uL — ABNORMAL HIGH (ref 3.87–5.11)
RDW: 19 % — ABNORMAL HIGH (ref 11.5–15.5)
WBC Count: 7.2 K/uL (ref 4.0–10.5)
nRBC: 0 % (ref 0.0–0.2)

## 2023-08-06 LAB — RETIC PANEL
Immature Retic Fract: 11.7 % (ref 2.3–15.9)
RBC.: 5.92 MIL/uL — ABNORMAL HIGH (ref 3.87–5.11)
Retic Count, Absolute: 71.6 K/uL (ref 19.0–186.0)
Retic Ct Pct: 1.2 % (ref 0.4–3.1)
Reticulocyte Hemoglobin: 24.3 pg — ABNORMAL LOW (ref >27.9)

## 2023-08-06 LAB — VITAMIN D 25 HYDROXY (VIT D DEFICIENCY, FRACTURES): Vit D, 25-Hydroxy: 31.65 ng/mL (ref 30–100)

## 2023-08-06 LAB — VITAMIN B12: Vitamin B-12: 346 pg/mL (ref 180–914)

## 2023-08-06 LAB — LACTATE DEHYDROGENASE: LDH: 162 U/L (ref 98–192)

## 2023-08-06 MED ORDER — IRON SUCROSE 20 MG/ML IV SOLN
200.0000 mg | Freq: Once | INTRAVENOUS | Status: AC
  Administered 2023-08-06: 200 mg via INTRAVENOUS
  Filled 2023-08-06: qty 10

## 2023-08-06 NOTE — Progress Notes (Signed)
 [facility] CONSULT NOTE  Patient Care Team: Johnie Perkins, PA-C as PCP - General (Family Medicine) Rennie Cindy SAUNDERS, MD as Consulting Physician (Oncology) Rennie Cindy SAUNDERS, MD as Consulting Physician (Hematology and Oncology)  CHIEF COMPLAINTS/PURPOSE OF CONSULTATION: ANEMIA   HEMATOLOGY HISTORY  # ANEMIA[Hb; MCV-platelets- WBC; Iron  sat; ferritin;  GFR- CT/US - ;    Latest Reference Range & Units 05/29/23 07:12  Iron  28 - 170 ug/dL 25 (L)  UIBC ug/dL 580  TIBC 749 - 549 ug/dL 555  Saturation Ratios 10.4 - 31.8 % 6 (L)  Ferritin 11 - 307 ng/mL 5 (L)  WBC 4.0 - 10.5 K/uL 6.2  RBC 3.87 - 5.11 MIL/uL 4.71  Hemoglobin 12.0 - 15.0 g/dL 9.3 (L)  HCT 63.9 - 53.9 % 31.0 (L)  MCV 80.0 - 100.0 fL 65.8 (L)  MCH 26.0 - 34.0 pg 19.7 (L)  MCHC 30.0 - 36.0 g/dL 69.9  RDW 88.4 - 84.4 % 18.6 (H)  Platelets 150 - 400 K/uL 306  nRBC 0.0 - 0.2 % 0.0  (L): Data is abnormally low (H): Data is abnormally high  HISTORY OF PRESENTING ILLNESS: Patient ambulating-independently.  With husband.   Virginia Webb 48 y.o.  female pleasant patient with anemia- with iron  deficiency sec to history of gastric lap band 2009 / history of thalassemia trait - is here for a follow up.   Patient noted to have improvement of energy levels after Venofer .  However her energy levels are not back to baseline.  Unfortunately she had quit her job which was quite strenuous.    Review of Systems  Constitutional:  Positive for malaise/fatigue. Negative for chills, diaphoresis, fever and weight loss.  HENT:  Negative for nosebleeds and sore throat.   Eyes:  Negative for double vision.  Respiratory:  Negative for cough, hemoptysis, sputum production and wheezing.   Cardiovascular:  Negative for chest pain, palpitations, orthopnea and leg swelling.  Gastrointestinal:  Negative for abdominal pain, blood in stool, constipation, diarrhea, heartburn, melena, nausea and vomiting.  Genitourinary:   Negative for dysuria, frequency and urgency.  Musculoskeletal:  Negative for back pain and joint pain.  Skin: Negative.  Negative for itching and rash.  Neurological:  Negative for dizziness, tingling, focal weakness, weakness and headaches.  Endo/Heme/Allergies:  Does not bruise/bleed easily.  Psychiatric/Behavioral:  Negative for depression. The patient is not nervous/anxious and does not have insomnia.      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    SURGICAL HISTORY: Past Surgical History:  Procedure Laterality Date   CESAREAN SECTION  01/31/1995   CESAREAN SECTION  10/22/2000   CYSTOSCOPY N/A 08/13/2016   Procedure: CYSTOSCOPY;  Surgeon: Schermerhorn, Debby PARAS, MD;  Location: [facility] ORS;  Service: Gynecology;  Laterality: N/A;   FLAT FOOT CORRECTION Right 10/13/2020   Procedure: FLAT FOOT CORRECTION-EVANS OSTEOTOMY, COTTON OSTEOTOMY;  Surgeon: Lennie Barter, DPM;  Location: [facility];  Service: Podiatry;  Laterality: Right;   FOOT ARTHRODESIS Right 05/31/2022   Procedure: ARTHRODESIS FOOT;  Surgeon: Lennie Barter, DPM;  Location: [facility];  Service: Podiatry;  Laterality: Right;   GASTROC RECESSION EXTREMITY Right 10/13/2020   Procedure: GASTROC RECESSION;  Surgeon: Lennie Barter, DPM;  Location: [facility];  Service: Podiatry;  Laterality: Right;  Anesthesia- General, pre op pop/saph block   HARDWARE REMOVAL Right 05/31/2022   Procedure: HARDWARE REMOVAL;  Surgeon: Lennie Barter,  DPM;  Location: [facility];  Service: Podiatry;  Laterality: Right;   Lap Band     LAPAROSCOPIC LYSIS OF ADHESIONS  08/13/2016   Procedure: LAPAROSCOPIC LYSIS OF ADHESIONS;  Surgeon: Schermerhorn, Debby PARAS, MD;  Location: [facility] ORS;  Service: Gynecology;;   LAPAROSCOPIC SUPRACERVICAL HYSTERECTOMY N/A 08/13/2016   Procedure: LAPAROSCOPIC SUPRACERVICAL HYSTERECTOMY;  Surgeon: Lovetta Debby PARAS, MD;  Location: [facility] ORS;  Service: Gynecology;  Laterality: N/A;   LAPAROSCOPIC UNILATERAL SALPINGECTOMY Right 08/13/2016   Procedure: LAPAROSCOPIC UNILATERAL SALPINGECTOMY;  Surgeon: Schermerhorn, Debby PARAS, MD;  Location: [facility] ORS;  Service: Gynecology;  Laterality: Right;   TUBAL LIGATION      SOCIAL HISTORY: Social History   Socioeconomic History   Marital status: Married    Spouse name: Not on file   Number of children: 2   Years of education: Not on file   Highest education level: Not on file  Occupational History    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 EMPLOYED    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  Tobacco Use   Smoking status: Every Day    Current packs/day: 0.25    Average packs/day: 0.3 packs/day for 10.0 years (2.5 ttl pk-yrs)    Types: Cigarettes   Smokeless tobacco: Never   Tobacco comments:    Has smoked off and on for last 25 years  Vaping Use   Vaping status: Never Used  Substance and Sexual Activity   Alcohol use: Yes    Alcohol/week: 2.0 standard drinks of alcohol    Types: 2 Glasses of wine per week    Comment: rarely   Drug use: No   Sexual activity: Yes    Birth control/protection: None  Other Topics Concern   Not on file  Social History Narrative   Not on file   Social Drivers of Health   Financial Resource Strain: Not on file  Food Insecurity: No Food Insecurity (06/03/2023)   Hunger Vital Sign    Worried About Running Out of Food in the Last Year: Never true    Ran Out of Food in the Last Year: Never true  Transportation Needs: No Transportation Needs (06/03/2023)   PRAPARE -  Administrator, Civil Service (Medical): No    Lack of Transportation (Non-Medical): No  Physical Activity: Not on file  Stress: Not on file  Social Connections: Not on file  Intimate Partner Violence: Not At Risk (06/03/2023)   Humiliation, Afraid, Rape, and Kick questionnaire    Fear of Current or Ex-Partner: No    Emotionally Abused: No    Physically Abused: No    Sexually Abused: No    FAMILY HISTORY: Family History  Problem Relation Age of Onset   Alcohol abuse Father     ALLERGIES:  has no known allergies.  MEDICATIONS:  Current Outpatient Medications  Medication Sig Dispense Refill   gabapentin (NEURONTIN) 100 MG capsule Take 100 mg by mouth.     hydrochlorothiazide (HYDRODIURIL) 25 MG tablet Take 25 mg by mouth daily.  methylPREDNISolone (MEDROL DOSEPAK) 4 MG TBPK tablet See admin instructions. follow package directions     Multiple Vitamins-Minerals (MULTIVITAMIN ADULT) CHEW Chew by mouth daily.     ondansetron  (ZOFRAN ) 4 MG tablet Take 1 tablet (4 mg total) by mouth every 8 (eight) hours as needed for nausea or vomiting. 20 tablet 0   Ferrous Sulfate (IRON  PO) Take by mouth daily. (Patient not taking: Reported on 08/06/2023)     No current facility-administered medications for this visit.     PHYSICAL EXAMINATION:   Vitals:   08/06/23 0940  BP: 130/76  Pulse: 60  Resp: 16  Temp: 98.3 F (36.8 C)  SpO2: 97%   Filed Weights   08/06/23 0940  Weight: 290 lb (131.5 kg)    Physical Exam Vitals and nursing note reviewed.  HENT:     Head: Normocephalic and atraumatic.     Mouth/Throat:     Pharynx: Oropharynx is clear.  Eyes:     Extraocular Movements: Extraocular movements intact.     Pupils: Pupils are equal, round, and reactive to light.  Cardiovascular:     Rate and Rhythm: Normal rate and regular rhythm.  Pulmonary:     Comments: Decreased breath sounds bilaterally.  Abdominal:     Palpations: Abdomen is soft.  Musculoskeletal:         General: Normal range of motion.     Cervical back: Normal range of motion.  Skin:    General: Skin is warm.  Neurological:     General: No focal deficit present.     Mental Status: She is alert and oriented to person, place, and time.  Psychiatric:        Behavior: Behavior normal.        Judgment: Judgment normal.      LABORATORY DATA:  I have reviewed the data as listed Lab Results  Component Value Date   WBC 7.2 08/06/2023   HGB 12.7 08/06/2023   HCT 40.5 08/06/2023   MCV 68.1 (L) 08/06/2023   PLT 334 08/06/2023   Recent Labs    03/19/23 0925 05/29/23 0712 08/06/23 0926  NA 137 137 139  K 3.3* 3.1* 3.4*  CL 102 100 98  CO2 27 27 30   GLUCOSE 92 96 92  BUN 24* 22* 23*  CREATININE 1.21* 1.03* 1.05*  CALCIUM 9.0 9.2 9.5  GFRNONAA 55* >60 >60  PROT  --   --  7.8  ALBUMIN  --   --  4.0  AST  --   --  15  ALT  --   --  15  ALKPHOS  --   --  71  BILITOT  --   --  0.6     No results found.  ASSESSMENT & PLAN:   Symptomatic anemia #Symptomatic  Anemia/thalassemia trait Hb 9; ferritin [May 2025]-symptomatic.  From  iron  deficiency - from etiology/malabsorption-gastric lap band. Recommend barimelt+ iron  [over-the-counter medication/can buy online].   # S/p IV iron  infusion/Venofer - proceed with venofer .   #Etiology of iron  deficiency: lapband- [2009- [place]; s/p TAH- 2020]; stronlg recommend colon cancer screenig.   # Hx of B12 def- recommend barimelt.   # vit D def -recommend barimelt.   TAH- No pregnancy tests  # DISPOSITION: # venofer -  # venofer  in 1 month- # follow up 4  months- MD- labs- cbc/bmp; iron  studies; ferritin; b12- possible venofer ;  Dr.B  Ms.Johnie RIGGERS    All questions were answered. The patient knows to call the clinic with any  problems, questions or concerns.    Cindy JONELLE Joe, MD 08/06/2023 10:03 AM

## 2023-08-06 NOTE — Assessment & Plan Note (Addendum)
#  Symptomatic  Anemia/thalassemia trait Hb 9; ferritin [May 2025]-symptomatic.  From  iron  deficiency - from etiology/malabsorption-gastric lap band. Recommend barimelt+ iron  [over-the-counter medication/can buy online].   # S/p IV iron  infusion/Venofer - proceed with venofer .   #Etiology of iron  deficiency: lapband- [2009- [place]; s/p TAH- 2020]; stronlg recommend colon cancer screenig.   # Hx of B12 def- recommend barimelt.   # vit D def -recommend barimelt.   TAH- No pregnancy tests  # DISPOSITION: # venofer -  # venofer  in 1 month- # follow up 4  months- MD- labs- cbc/bmp; iron  studies; ferritin; b12- possible venofer ;  Dr.B  Ms.Franklin PA-C

## 2023-08-06 NOTE — Progress Notes (Signed)
 Pt in for follow up reports still being weak and tired.

## 2023-08-06 NOTE — Patient Instructions (Signed)
#   Recommend barimelt+ iron  [over-the-counter medication/can buy online].    under the tongue. These pills need to dissolve under the tongue/and should be absorbed into your bloodstream.

## 2023-08-07 LAB — HAPTOGLOBIN: Haptoglobin: 359 mg/dL — ABNORMAL HIGH (ref 42–296)

## 2023-09-06 ENCOUNTER — Inpatient Hospital Stay: Attending: Internal Medicine

## 2023-09-19 ENCOUNTER — Inpatient Hospital Stay: Attending: Internal Medicine

## 2023-09-19 VITALS — BP 115/86 | HR 62 | Temp 98.0°F | Resp 18

## 2023-09-19 DIAGNOSIS — D649 Anemia, unspecified: Secondary | ICD-10-CM

## 2023-09-19 DIAGNOSIS — D509 Iron deficiency anemia, unspecified: Secondary | ICD-10-CM | POA: Diagnosis present

## 2023-09-19 MED ORDER — IRON SUCROSE 20 MG/ML IV SOLN
200.0000 mg | Freq: Once | INTRAVENOUS | Status: AC
  Administered 2023-09-19: 200 mg via INTRAVENOUS
  Filled 2023-09-19: qty 10

## 2023-09-19 NOTE — Patient Instructions (Signed)

## 2023-10-02 ENCOUNTER — Other Ambulatory Visit

## 2023-10-02 ENCOUNTER — Ambulatory Visit

## 2023-10-02 ENCOUNTER — Ambulatory Visit: Admitting: Internal Medicine

## 2023-10-18 ENCOUNTER — Ambulatory Visit

## 2023-12-06 ENCOUNTER — Encounter: Payer: Self-pay | Admitting: Internal Medicine

## 2023-12-06 ENCOUNTER — Inpatient Hospital Stay: Attending: Internal Medicine

## 2023-12-06 ENCOUNTER — Inpatient Hospital Stay: Admitting: Internal Medicine

## 2023-12-06 ENCOUNTER — Inpatient Hospital Stay

## 2023-12-06 VITALS — BP 133/87 | HR 58

## 2023-12-06 DIAGNOSIS — E538 Deficiency of other specified B group vitamins: Secondary | ICD-10-CM | POA: Insufficient documentation

## 2023-12-06 DIAGNOSIS — E559 Vitamin D deficiency, unspecified: Secondary | ICD-10-CM | POA: Insufficient documentation

## 2023-12-06 DIAGNOSIS — D509 Iron deficiency anemia, unspecified: Secondary | ICD-10-CM | POA: Insufficient documentation

## 2023-12-06 DIAGNOSIS — D649 Anemia, unspecified: Secondary | ICD-10-CM

## 2023-12-06 LAB — BASIC METABOLIC PANEL - CANCER CENTER ONLY
Anion gap: 12 (ref 5–15)
BUN: 16 mg/dL (ref 6–20)
CO2: 29 mmol/L (ref 22–32)
Calcium: 9.6 mg/dL (ref 8.9–10.3)
Chloride: 101 mmol/L (ref 98–111)
Creatinine: 1.07 mg/dL — ABNORMAL HIGH (ref 0.44–1.00)
GFR, Estimated: >60 mL/min (ref >60)
Glucose, Bld: 89 mg/dL (ref 70–99)
Potassium: 3.2 mmol/L — ABNORMAL LOW (ref 3.5–5.1)
Sodium: 142 mmol/L (ref 135–145)

## 2023-12-06 LAB — CBC WITH DIFFERENTIAL (CANCER CENTER ONLY)
Abs Immature Granulocytes: 0.02 K/uL (ref 0.00–0.07)
Basophils Absolute: 0.1 K/uL (ref 0.0–0.1)
Basophils Relative: 2 %
Eosinophils Absolute: 0.1 K/uL (ref 0.0–0.5)
Eosinophils Relative: 3 %
HCT: 38.6 % (ref 36.0–46.0)
Hemoglobin: 12.3 g/dL (ref 12.0–15.0)
Immature Granulocytes: 0 %
Lymphocytes Relative: 31 %
Lymphs Abs: 1.5 K/uL (ref 0.7–4.0)
MCH: 22.2 pg — ABNORMAL LOW (ref 26.0–34.0)
MCHC: 31.9 g/dL (ref 30.0–36.0)
MCV: 69.8 fL — ABNORMAL LOW (ref 80.0–100.0)
Monocytes Absolute: 0.4 K/uL (ref 0.1–1.0)
Monocytes Relative: 7 %
Neutro Abs: 2.8 K/uL (ref 1.7–7.7)
Neutrophils Relative %: 57 %
Platelet Count: 298 K/uL (ref 150–400)
RBC: 5.53 MIL/uL — ABNORMAL HIGH (ref 3.87–5.11)
RDW: 15.4 % (ref 11.5–15.5)
WBC Count: 4.8 K/uL (ref 4.0–10.5)
nRBC: 0 % (ref 0.0–0.2)

## 2023-12-06 LAB — FERRITIN: Ferritin: 150 ng/mL (ref 11–307)

## 2023-12-06 LAB — IRON AND TIBC
Iron: 77 ug/dL (ref 28–170)
Saturation Ratios: 22 % (ref 10.4–31.8)
TIBC: 357 ug/dL (ref 250–450)
UIBC: 280 ug/dL

## 2023-12-06 LAB — VITAMIN B12: Vitamin B-12: 317 pg/mL (ref 180–914)

## 2023-12-06 MED ORDER — IRON SUCROSE 20 MG/ML IV SOLN
200.0000 mg | Freq: Once | INTRAVENOUS | Status: AC
  Administered 2023-12-06: 200 mg via INTRAVENOUS
  Filled 2023-12-06: qty 10

## 2023-12-06 NOTE — Progress Notes (Signed)
 [facility] CONSULT NOTE  Patient Care Team: Johnie Perkins, PA-C as PCP - General (Family Medicine) Rennie Cindy SAUNDERS, MD as Consulting Physician (Oncology) Rennie Cindy SAUNDERS, MD as Consulting Physician (Hematology and Oncology)  CHIEF COMPLAINTS/PURPOSE OF CONSULTATION: ANEMIA   HEMATOLOGY HISTORY  # ANEMIA[Hb; MCV-platelets- WBC; Iron  sat; ferritin;  GFR- CT/US - ;    Latest Reference Range & Units 05/29/23 07:12  Iron  28 - 170 ug/dL 25 (L)  UIBC ug/dL 580  TIBC 749 - 549 ug/dL 555  Saturation Ratios 10.4 - 31.8 % 6 (L)  Ferritin 11 - 307 ng/mL 5 (L)  WBC 4.0 - 10.5 K/uL 6.2  RBC 3.87 - 5.11 MIL/uL 4.71  Hemoglobin 12.0 - 15.0 g/dL 9.3 (L)  HCT 63.9 - 53.9 % 31.0 (L)  MCV 80.0 - 100.0 fL 65.8 (L)  MCH 26.0 - 34.0 pg 19.7 (L)  MCHC 30.0 - 36.0 g/dL 69.9  RDW 88.4 - 84.4 % 18.6 (H)  Platelets 150 - 400 K/uL 306  nRBC 0.0 - 0.2 % 0.0  (L): Data is abnormally low (H): Data is abnormally high  HISTORY OF PRESENTING ILLNESS: Patient ambulating-independently.  With husband.   Virginia Webb 48 y.o.  female pleasant patient with anemia- with iron  deficiency sec to history of gastric lap band 2009 / history of thalassemia trait - is here for a follow up.   Discussed the use of AI scribe software for clinical note transcription with the patient, who gave verbal consent to proceed.  History of Present Illness   Virginia Webb is a 48 year old female with anemia and secondary gastric bypass malabsorption who presents for follow-up.  She has been receiving iron  infusions, which have improved her symptoms, allowing her to perform everyday activities, though she still experiences some fatigue. Her hemoglobin levels have increased from previous values of 9 and 8 to recent values around 12. She takes Barimelt sublingual supplements and has had difficulty with oral supplements in the past.  She switched jobs due to her previous inability to perform her  duties.  She has not yet undergone a colonoscopy due to fear stemming from a past incident where a close friend's brother died following the procedure. She uses stool sample tests instead. Her father had a colonoscopy last year, during which a polyp was found, but he was fine afterwards.     Review of Systems  Constitutional:  Positive for malaise/fatigue. Negative for chills, diaphoresis, fever and weight loss.  HENT:  Negative for nosebleeds and sore throat.   Eyes:  Negative for double vision.  Respiratory:  Negative for cough, hemoptysis, sputum production and wheezing.   Cardiovascular:  Negative for chest pain, palpitations, orthopnea and leg swelling.  Gastrointestinal:  Negative for abdominal pain, blood in stool, constipation, diarrhea, heartburn, melena, nausea and vomiting.  Genitourinary:  Negative for dysuria, frequency and urgency.  Musculoskeletal:  Negative for back pain and joint pain.  Skin: Negative.  Negative for itching and rash.  Neurological:  Negative for dizziness, tingling, focal weakness, weakness and headaches.  Endo/Heme/Allergies:  Does not bruise/bleed easily.  Psychiatric/Behavioral:  Negative for depression. The patient is not nervous/anxious and does not have insomnia.      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 side affected    SURGICAL HISTORY: Past Surgical History:  Procedure Laterality Date   CESAREAN SECTION  01/31/1995   CESAREAN SECTION  10/22/2000   CYSTOSCOPY N/A 08/13/2016    Procedure: CYSTOSCOPY;  Surgeon: Schermerhorn, Debby PARAS, MD;  Location: [facility] ORS;  Service: Gynecology;  Laterality: N/A;   FLAT FOOT CORRECTION Right 10/13/2020   Procedure: FLAT FOOT CORRECTION-EVANS OSTEOTOMY, COTTON OSTEOTOMY;  Surgeon: Lennie Barter, DPM;  Location: [facility];  Service: Podiatry;  Laterality: Right;   FOOT ARTHRODESIS Right 05/31/2022   Procedure: ARTHRODESIS FOOT;  Surgeon: Lennie Barter, DPM;  Location: [facility];  Service: Podiatry;  Laterality: Right;   GASTROC RECESSION EXTREMITY Right 10/13/2020   Procedure: GASTROC RECESSION;  Surgeon: Lennie Barter, DPM;  Location: [facility];  Service: Podiatry;  Laterality: Right;  Anesthesia- General, pre op pop/saph block   HARDWARE REMOVAL Right 05/31/2022   Procedure: HARDWARE REMOVAL;  Surgeon: Lennie Barter, DPM;  Location: [facility];  Service: Podiatry;  Laterality: Right;   Lap Band     LAPAROSCOPIC LYSIS OF ADHESIONS  08/13/2016   Procedure: LAPAROSCOPIC LYSIS OF ADHESIONS;  Surgeon: Schermerhorn, Debby PARAS, MD;  Location: [facility] ORS;  Service: Gynecology;;   LAPAROSCOPIC SUPRACERVICAL HYSTERECTOMY N/A 08/13/2016   Procedure: LAPAROSCOPIC SUPRACERVICAL HYSTERECTOMY;  Surgeon: Lovetta Debby PARAS, MD;  Location: [facility] ORS;  Service: Gynecology;  Laterality: N/A;   LAPAROSCOPIC UNILATERAL SALPINGECTOMY Right 08/13/2016   Procedure: LAPAROSCOPIC UNILATERAL SALPINGECTOMY;  Surgeon: Schermerhorn, Debby PARAS, MD;  Location: [facility] ORS;  Service: Gynecology;  Laterality: Right;   TUBAL LIGATION      SOCIAL HISTORY: Social History   Socioeconomic History   Marital status: Married    Spouse name: Not on file   Number of children: 2   Years of education: Not on file   Highest education level: Not on file  Occupational History    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 EMPLOYED    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  Tobacco Use   Smoking status: Every Day    Current packs/day: 0.25    Average packs/day: 0.3 packs/day for 10.0  years (2.5 ttl pk-yrs)    Types: Cigarettes   Smokeless tobacco: Never   Tobacco comments:    Has smoked off and on for last 25 years  Vaping Use   Vaping status: Never Used  Substance and Sexual Activity   Alcohol use: Yes    Alcohol/week: 2.0 standard drinks of alcohol    Types: 2 Glasses of wine per week    Comment: rarely   Drug use: No   Sexual activity: Yes    Birth control/protection: None  Other Topics Concern   Not on file  Social History Narrative   Not on file   Social Drivers of Health   Financial Resource Strain: Not on file  Food Insecurity: No Food Insecurity (06/03/2023)   Hunger Vital Sign    Worried About Running Out of Food in the Last Year: Never true    Ran Out of Food in the Last Year: Never true  Transportation Needs: No Transportation Needs (06/03/2023)   PRAPARE - Administrator, Civil Service (Medical): No    Lack of Transportation (  Non-Medical): No  Physical Activity: Not on file  Stress: Not on file  Social Connections: Not on file  Intimate Partner Violence: Not At Risk (06/03/2023)   Humiliation, Afraid, Rape, and Kick questionnaire    Fear of Current or Ex-Partner: No    Emotionally Abused: No    Physically Abused: No    Sexually Abused: No    FAMILY HISTORY: Family History  Problem Relation Age of Onset   Alcohol abuse Father     ALLERGIES:  has no known allergies.  MEDICATIONS:  Current Outpatient Medications  Medication Sig Dispense Refill   Ferrous Sulfate (IRON  PO) Take by mouth daily.     hydrochlorothiazide (HYDRODIURIL) 25 MG tablet Take 25 mg by mouth daily.     Multiple Vitamins-Minerals (MULTIVITAMIN ADULT) CHEW Chew by mouth daily.     No current facility-administered medications for this visit.     PHYSICAL EXAMINATION:   Vitals:   12/06/23 0953 12/06/23 1021  BP: (!) 130/95 (!) 118/92  Pulse: 64   Resp: 16   Temp: 99.1 F (37.3 C)   SpO2: 100%    Filed Weights   12/06/23 0953  Weight: 294 lb  9.6 oz (133.6 kg)    Physical Exam Vitals and nursing note reviewed.  HENT:     Head: Normocephalic and atraumatic.     Mouth/Throat:     Pharynx: Oropharynx is clear.  Eyes:     Extraocular Movements: Extraocular movements intact.     Pupils: Pupils are equal, round, and reactive to light.  Cardiovascular:     Rate and Rhythm: Normal rate and regular rhythm.  Pulmonary:     Comments: Decreased breath sounds bilaterally.  Abdominal:     Palpations: Abdomen is soft.  Musculoskeletal:        General: Normal range of motion.     Cervical back: Normal range of motion.  Skin:    General: Skin is warm.  Neurological:     General: No focal deficit present.     Mental Status: She is alert and oriented to person, place, and time.  Psychiatric:        Behavior: Behavior normal.        Judgment: Judgment normal.      LABORATORY DATA:  I have reviewed the data as listed Lab Results  Component Value Date   WBC 4.8 12/06/2023   HGB 12.3 12/06/2023   HCT 38.6 12/06/2023   MCV 69.8 (L) 12/06/2023   PLT 298 12/06/2023   Recent Labs    05/29/23 0712 08/06/23 0926 12/06/23 0955  NA 137 139 142  K 3.1* 3.4* 3.2*  CL 100 98 101  CO2 27 30 29   GLUCOSE 96 92 89  BUN 22* 23* 16  CREATININE 1.03* 1.05* 1.07*  CALCIUM 9.2 9.5 9.6  GFRNONAA >60 >60 >60  PROT  --  7.8  --   ALBUMIN  --  4.0  --   AST  --  15  --   ALT  --  15  --   ALKPHOS  --  71  --   BILITOT  --  0.6  --      No results found.  ASSESSMENT & PLAN:   Symptomatic anemia #Symptomatic  Anemia/thalassemia trait Hb 9; ferritin [May 2025]-symptomatic.  From  iron  deficiency - from etiology/malabsorption-gastric lap band. continue barimelt+ iron  [over-the-counter medication/can buy online].   # S/p IV iron  infusion/Venofer - proceed with venofer .   #Etiology of iron  deficiency: lapband- [2009- [place];  s/p TAH- 2020]; stronlg recommend colon cancer screenig.   # Hx of B12 def- continue barimelt.   # vit D def  -continue barimelt.   TAH- No pregnancy tests  # DISPOSITION: # venofer -  # follow up 4  months- MD- labs- cbc/bmp; iron  studies; ferritin; b12- possible venofer ;  Dr.B  Ms.Johnie RIGGERS    All questions were answered. The patient knows to call the clinic with any problems, questions or concerns.    Cindy JONELLE Joe, MD 12/06/2023 10:52 AM

## 2023-12-06 NOTE — Assessment & Plan Note (Addendum)
#  Symptomatic  Anemia/thalassemia trait Hb 9; ferritin [May 2025]-symptomatic.  From  iron  deficiency - from etiology/malabsorption-gastric lap band. continue barimelt+ iron  [over-the-counter medication/can buy online].   # S/p IV iron  infusion/Venofer - proceed with venofer .   #Etiology of iron  deficiency: lapband- [2009- [place]; s/p TAH- 2020]; stronlg recommend colon cancer screenig.   # Hx of B12 def- continue barimelt.   # vit D def -continue barimelt.   TAH- No pregnancy tests  # DISPOSITION: # venofer -  # follow up 4  months- MD- labs- cbc/bmp; iron  studies; ferritin; b12- possible venofer ;  Dr.B  Ms.Franklin PA-C

## 2023-12-06 NOTE — Progress Notes (Signed)
 Fatigue/weakness: NO Dyspena: NO  Light headedness: NO  Blood in stool: NO

## 2023-12-06 NOTE — Patient Instructions (Signed)

## 2024-04-06 ENCOUNTER — Inpatient Hospital Stay

## 2024-04-06 ENCOUNTER — Inpatient Hospital Stay: Admitting: Internal Medicine
